# Patient Record
Sex: Male | Born: 1975 | Race: Black or African American | Hispanic: No | Marital: Single | Smoking: Never smoker
Health system: Southern US, Community
[De-identification: ages and names within clinical notes are randomized; demographics above are authoritative.]

## PROBLEM LIST (undated history)

## (undated) DIAGNOSIS — I219 Acute myocardial infarction, unspecified: Secondary | ICD-10-CM

## (undated) DIAGNOSIS — I2510 Atherosclerotic heart disease of native coronary artery without angina pectoris: Secondary | ICD-10-CM

## (undated) DIAGNOSIS — J45909 Unspecified asthma, uncomplicated: Secondary | ICD-10-CM

## (undated) HISTORY — DX: Acute myocardial infarction, unspecified: I21.9

## (undated) HISTORY — PX: CARDIAC CATHETERIZATION: SHX172

---

## 2006-11-14 ENCOUNTER — Emergency Department (HOSPITAL_COMMUNITY): Admission: EM | Admit: 2006-11-14 | Discharge: 2006-11-14 | Payer: Self-pay | Admitting: Emergency Medicine

## 2013-01-02 ENCOUNTER — Encounter (HOSPITAL_COMMUNITY): Payer: Self-pay

## 2013-01-02 ENCOUNTER — Emergency Department (HOSPITAL_COMMUNITY)
Admission: EM | Admit: 2013-01-02 | Discharge: 2013-01-02 | Disposition: A | Discharge status: Home / Self Care | Payer: BC Managed Care – PPO | Attending: Emergency Medicine | Admitting: Emergency Medicine

## 2013-01-02 DIAGNOSIS — J45909 Unspecified asthma, uncomplicated: Secondary | ICD-10-CM | POA: Insufficient documentation

## 2013-01-02 DIAGNOSIS — J329 Chronic sinusitis, unspecified: Secondary | ICD-10-CM

## 2013-01-02 DIAGNOSIS — R112 Nausea with vomiting, unspecified: Secondary | ICD-10-CM | POA: Insufficient documentation

## 2013-01-02 DIAGNOSIS — R05 Cough: Secondary | ICD-10-CM | POA: Insufficient documentation

## 2013-01-02 DIAGNOSIS — Z792 Long term (current) use of antibiotics: Secondary | ICD-10-CM | POA: Insufficient documentation

## 2013-01-02 DIAGNOSIS — Z79899 Other long term (current) drug therapy: Secondary | ICD-10-CM | POA: Insufficient documentation

## 2013-01-02 DIAGNOSIS — R059 Cough, unspecified: Secondary | ICD-10-CM | POA: Insufficient documentation

## 2013-01-02 DIAGNOSIS — Z791 Long term (current) use of non-steroidal anti-inflammatories (NSAID): Secondary | ICD-10-CM | POA: Insufficient documentation

## 2013-01-02 DIAGNOSIS — R51 Headache: Secondary | ICD-10-CM | POA: Insufficient documentation

## 2013-01-02 DIAGNOSIS — Z87891 Personal history of nicotine dependence: Secondary | ICD-10-CM | POA: Insufficient documentation

## 2013-01-02 HISTORY — DX: Unspecified asthma, uncomplicated: J45.909

## 2013-01-02 MED ORDER — CETIRIZINE HCL 10 MG PO TABS: 10.0000 mg | ORAL_TABLET | Freq: Every day | ORAL | Status: DC

## 2013-01-02 MED ORDER — AZITHROMYCIN 250 MG PO TABS: 250.0000 mg | ORAL_TABLET | Freq: Every day | ORAL | Status: DC

## 2013-01-02 NOTE — ED Provider Notes (Signed)
CSN: [id number]     Arrival date & time 01/02/13  1717 History     First MD Initiated Contact with Patient 01/02/13 1738     Chief Complaint  Patient presents with  . Sinusitis   (Consider location/radiation/quality/duration/timing/severity/associated sxs/prior Treatment) HPI Comments: 37 year old male who presents with 2 days of sinus pressure and headache that is now associated with nausea and vomiting. He also has a dry cough. He has tried over-the-counter medications such as Mucinex without any relief. This is gradually worsening, persistent, not associated with fever or chills. He does not have frequent infections, he is not a diabetic, he does not have HIV.  The history is provided by the patient.    Past Medical History  Diagnosis Date  . Asthma    History reviewed. No pertinent past surgical history. Family History  Problem Relation Age of Onset  . Stroke Father   . Hypertension Father   . Hypertension Brother    History  Substance Use Topics  . Smoking status: Former Games developer  . Smokeless tobacco: Never Used  . Alcohol Use: Yes     Comment: socially    Review of Systems  All other systems reviewed and are negative.    Allergies  Review of patient's allergies indicates no known allergies.  Home Medications   Current Outpatient Rx  Name  Route  Sig  Dispense  Refill  . dextromethorphan-guaiFENesin (MUCINEX DM) 30-600 MG per 12 hr tablet   Oral   Take 1 tablet by mouth every 12 (twelve) hours.         . naproxen sodium (ANAPROX) 220 MG tablet   Oral   Take 440 mg by mouth 2 (two) times daily with a meal.         . azithromycin (ZITHROMAX Z-PAK) 250 MG tablet   Oral   Take 1 tablet (250 mg total) by mouth daily. 500mg  PO day 1, then 250mg  PO days 205   6 tablet   0   . cetirizine (ZYRTEC ALLERGY) 10 MG tablet   Oral   Take 1 tablet (10 mg total) by mouth daily.   30 tablet   1    BP 127/69  Pulse 108  Temp(Src) 99 F (37.2 C) (Oral)  Resp  18  Ht 5\' 9"  (1.753 m)  Wt 191 lb 6 oz (86.807 kg)  BMI 28.25 kg/m2  SpO2 99% Physical Exam  Nursing note and vitals reviewed. Constitutional: He appears well-developed and well-nourished. No distress.  HENT:  Head: Normocephalic and atraumatic.  Mouth/Throat: Oropharynx is clear and moist. No oropharyngeal exudate.  Nasal congestion bilaterally with mild swollen turbinates, no significant discharge the patient does endorse blowing green mucus out of his nose earlier today. Oropharynx is clear. Tympanic membranes are clear.  Eyes: Conjunctivae and EOM are normal. Pupils are equal, round, and reactive to light. Right eye exhibits no discharge. Left eye exhibits no discharge. No scleral icterus.  Neck: Normal range of motion. Neck supple. No JVD present. No thyromegaly present.  Cardiovascular: Normal rate, regular rhythm, normal heart sounds and intact distal pulses.  Exam reveals no gallop and no friction rub.   No murmur heard. Pulmonary/Chest: Effort normal and breath sounds normal. No respiratory distress. He has no wheezes. He has no rales.  Abdominal: Soft. Bowel sounds are normal. He exhibits no distension and no mass. There is no tenderness.  Musculoskeletal: Normal range of motion. He exhibits no edema and no tenderness.  Lymphadenopathy:    He has  no cervical adenopathy.  Neurological: He is alert. Coordination normal.  Skin: Skin is warm and dry. No rash noted. No erythema.  Psychiatric: He has a normal mood and affect. His behavior is normal.    ED Course   Procedures (including critical care time)  Labs Reviewed - No data to display No results found. 1. Sinusitis     MDM  Overall the patient appears nontoxic, he is protecting his airway without any difficulty, afebrile, normal blood pressure and normal oxygen saturation. Posterior Zithromax with Zyrtec, routine follow up with family doctor, patient is in agreement with plan.   Meds given in ED:  Medications - No  data to display  New Prescriptions   AZITHROMYCIN (ZITHROMAX Z-PAK) 250 MG TABLET    Take 1 tablet (250 mg total) by mouth daily. 500mg  PO day 1, then 250mg  PO days 205   CETIRIZINE (ZYRTEC ALLERGY) 10 MG TABLET    Take 1 tablet (10 mg total) by mouth daily.      Vida Roller, MD 01/02/13 [phone]

## 2013-01-02 NOTE — ED Notes (Signed)
Patient c/o sinusitis and headache that began yesterday. Patient also c/o hacking cough.

## 2013-02-13 ENCOUNTER — Emergency Department (HOSPITAL_COMMUNITY)
Admission: EM | Admit: 2013-02-13 | Discharge: 2013-02-14 | Disposition: A | Discharge status: Home / Self Care | Payer: BC Managed Care – PPO | Attending: Emergency Medicine | Admitting: Emergency Medicine

## 2013-02-13 ENCOUNTER — Encounter (HOSPITAL_COMMUNITY): Payer: Self-pay | Admitting: *Deleted

## 2013-02-13 DIAGNOSIS — Z7982 Long term (current) use of aspirin: Secondary | ICD-10-CM | POA: Insufficient documentation

## 2013-02-13 DIAGNOSIS — Z87891 Personal history of nicotine dependence: Secondary | ICD-10-CM | POA: Insufficient documentation

## 2013-02-13 DIAGNOSIS — Z79899 Other long term (current) drug therapy: Secondary | ICD-10-CM | POA: Insufficient documentation

## 2013-02-13 DIAGNOSIS — I2510 Atherosclerotic heart disease of native coronary artery without angina pectoris: Secondary | ICD-10-CM | POA: Insufficient documentation

## 2013-02-13 DIAGNOSIS — R112 Nausea with vomiting, unspecified: Secondary | ICD-10-CM | POA: Insufficient documentation

## 2013-02-13 DIAGNOSIS — I252 Old myocardial infarction: Secondary | ICD-10-CM | POA: Insufficient documentation

## 2013-02-13 DIAGNOSIS — R109 Unspecified abdominal pain: Secondary | ICD-10-CM | POA: Insufficient documentation

## 2013-02-13 DIAGNOSIS — J45909 Unspecified asthma, uncomplicated: Secondary | ICD-10-CM | POA: Insufficient documentation

## 2013-02-13 HISTORY — DX: Atherosclerotic heart disease of native coronary artery without angina pectoris: I25.10

## 2013-02-13 NOTE — ED Notes (Signed)
Vomiting since yesterday; c/o abd pain; cramps; pt stopped plavix yesterday thinking was contributing to pain

## 2013-02-13 NOTE — ED Notes (Signed)
Pt up to restroom to try to provide urine specimen unable to urinate at this time

## 2013-02-14 LAB — BASIC METABOLIC PANEL
BUN: 11 mg/dL (ref 6–23)
CO2: 24 mEq/L (ref 19–32)
GFR calc non Af Amer: >90 mL/min (ref >90)
Glucose, Bld: 98 mg/dL (ref 70–99)
Potassium: 4.4 mEq/L (ref 3.5–5.1)

## 2013-02-14 LAB — URINALYSIS, ROUTINE W REFLEX MICROSCOPIC
Bilirubin Urine: NEGATIVE
Glucose, UA: NEGATIVE mg/dL
Hgb urine dipstick: NEGATIVE
Specific Gravity, Urine: 1.02 (ref 1.005–1.030)
Urobilinogen, UA: 0.2 mg/dL (ref 0.0–1.0)

## 2013-02-14 LAB — HEPATIC FUNCTION PANEL
ALT: 125 U/L — ABNORMAL HIGH (ref 0–53)
AST: 45 U/L — ABNORMAL HIGH (ref 0–37)
Albumin: 4 g/dL (ref 3.5–5.2)
Alkaline Phosphatase: 87 U/L (ref 39–117)
Total Bilirubin: 0.4 mg/dL (ref 0.3–1.2)

## 2013-02-14 LAB — CBC WITH DIFFERENTIAL/PLATELET
Eosinophils Absolute: 0.4 10*3/uL (ref 0.0–0.7)
Eosinophils Relative: 8 % — ABNORMAL HIGH (ref 0–5)
Hemoglobin: 15.8 g/dL (ref 13.0–17.0)
Lymphs Abs: 2.5 10*3/uL (ref 0.7–4.0)
MCH: 29.3 pg (ref 26.0–34.0)
MCV: 84 fL (ref 78.0–100.0)
Monocytes Relative: 16 % — ABNORMAL HIGH (ref 3–12)
Neutrophils Relative %: 27 % — ABNORMAL LOW (ref 43–77)
RBC: 5.39 MIL/uL (ref 4.22–5.81)

## 2013-02-14 MED ORDER — ONDANSETRON 8 MG PO TBDP
ORAL_TABLET | ORAL | Status: AC
  Filled 2013-02-14: qty 1

## 2013-02-14 MED ORDER — ONDANSETRON HCL 8 MG PO TABS: 8.0000 mg | ORAL_TABLET | Freq: Three times a day (TID) | ORAL | Status: DC | PRN

## 2013-02-14 MED ORDER — ONDANSETRON 8 MG PO TBDP
8.0000 mg | ORAL_TABLET | Freq: Once | ORAL | Status: AC
  Administered 2013-02-14: 8 mg via ORAL

## 2013-02-14 NOTE — ED Provider Notes (Signed)
CSN: [id number]     Arrival date & time 02/13/13  2223 History   First MD Initiated Contact with Patient 02/14/13 [phone]     Chief Complaint  Patient presents with  . Abdominal Pain   (Consider location/radiation/quality/duration/timing/severity/associated sxs/prior Treatment) HPI Comments: OCTAVIAN GODEK is a 37 y.o. male who presents with nausea for one month and vomiting for 2 days. The symptoms started after he began taking Plavix and Lipitor. These were started one month ago after he had a myocardial infarction. He denies fever, chills, weakness, or dizziness. There's been no chest pain, diaphoresis, back pain, or diarrhea. He is due to see his cardiologist, tomorrow for followup care. He has never had this previously. There are no other known modifying factors.   Patient is a 37 y.o. male presenting with abdominal pain. The history is provided by the patient.  Abdominal Pain   Past Medical History  Diagnosis Date  . Asthma   . Coronary artery disease    History reviewed. No pertinent past surgical history. Family History  Problem Relation Age of Onset  . Stroke Father   . Hypertension Father   . Hypertension Brother    History  Substance Use Topics  . Smoking status: Former Games developer  . Smokeless tobacco: Never Used  . Alcohol Use: Yes     Comment: socially    Review of Systems  Gastrointestinal: Positive for abdominal pain.  All other systems reviewed and are negative.    Allergies  Review of patient's allergies indicates no known allergies.  Home Medications   Current Outpatient Rx  Name  Route  Sig  Dispense  Refill  . aspirin EC 81 MG tablet   Oral   Take 81 mg by mouth daily.         Marland Kitchen atorvastatin (LIPITOR) 40 MG tablet   Oral   Take 40 mg by mouth daily.         . cetirizine (ZYRTEC ALLERGY) 10 MG tablet   Oral   Take 1 tablet (10 mg total) by mouth daily.   30 tablet   1   . clopidogrel (PLAVIX) 75 MG tablet   Oral   Take 75 mg by  mouth daily.         Marland Kitchen dextromethorphan-guaiFENesin (MUCINEX DM) 30-600 MG per 12 hr tablet   Oral   Take 1 tablet by mouth every 12 (twelve) hours.         Marland Kitchen lisinopril (PRINIVIL,ZESTRIL) 2.5 MG tablet   Oral   Take 2.5 mg by mouth daily.         . metoprolol tartrate (LOPRESSOR) 25 MG tablet   Oral   Take 25 mg by mouth 2 (two) times daily.         . naproxen sodium (ANAPROX) 220 MG tablet   Oral   Take 440 mg by mouth 2 (two) times daily with a meal.         . nitroGLYCERIN (NITROSTAT) 0.4 MG SL tablet   Sublingual   Place 0.4 mg under the tongue every 5 (five) minutes as needed for chest pain (chest pain).         . ondansetron (ZOFRAN) 8 MG tablet   Oral   Take 1 tablet (8 mg total) by mouth every 8 (eight) hours as needed for nausea.   20 tablet   0    BP 116/63  Pulse 97  Temp(Src) 98.8 F (37.1 C)  Resp 12  SpO2 99% Physical Exam  Nursing note and vitals reviewed. Constitutional: He is oriented to person, place, and time. He appears well-developed and well-nourished. No distress.  HENT:  Head: Normocephalic and atraumatic.  Right Ear: External ear normal.  Left Ear: External ear normal.  Eyes: Conjunctivae and EOM are normal. Pupils are equal, round, and reactive to light.  Neck: Normal range of motion and phonation normal. Neck supple.  Cardiovascular: Normal rate, regular rhythm, normal heart sounds and intact distal pulses.   Pulmonary/Chest: Effort normal and breath sounds normal. He exhibits no bony tenderness.  Abdominal: Soft. Normal appearance and bowel sounds are normal. He exhibits no mass. There is tenderness (mild left upper and lower quadrant abdominal tenderness). There is no rebound and no guarding.  Musculoskeletal: Normal range of motion.  Neurological: He is alert and oriented to person, place, and time. No cranial nerve deficit or sensory deficit. He exhibits normal muscle tone. Coordination normal.  Skin: Skin is warm, dry and  intact.  Psychiatric: He has a normal mood and affect. His behavior is normal. Judgment and thought content normal.    ED Course  Procedures (including critical care time)  Medications  ondansetron (ZOFRAN-ODT) disintegrating tablet 8 mg (8 mg Oral Given 02/14/13 0117)       Labs Review Labs Reviewed  CBC WITH DIFFERENTIAL - Abnormal; Notable for the following:    Neutrophils Relative % 27 (*)    Neutro Abs 1.4 (*)    Lymphocytes Relative 49 (*)    Monocytes Relative 16 (*)    Eosinophils Relative 8 (*)    All other components within normal limits  HEPATIC FUNCTION PANEL - Abnormal; Notable for the following:    AST 45 (*)    ALT 125 (*)    All other components within normal limits  BASIC METABOLIC PANEL  URINALYSIS, ROUTINE W REFLEX MICROSCOPIC  LIPASE, BLOOD   Imaging Review No results found.  MDM   1. Nausea & vomiting    Nonspecific N&V, ddx includes GE and food toxin. He is stable for d/c.   Nursing Notes Reviewed/ Care Coordinated Applicable Imaging Reviewed Interpretation of Laboratory Data incorporated into ED treatment    Flint Melter, MD 02/15/13 1048

## 2013-02-14 NOTE — ED Notes (Signed)
Pt able to tolerate fluids without emesis.  Dr. Effie Shy made aware.

## 2013-03-29 ENCOUNTER — Encounter (HOSPITAL_COMMUNITY): Payer: Self-pay | Admitting: Emergency Medicine

## 2013-03-29 ENCOUNTER — Emergency Department (HOSPITAL_COMMUNITY)
Admission: EM | Admit: 2013-03-29 | Discharge: 2013-03-29 | Disposition: A | Discharge status: Home / Self Care | Payer: BC Managed Care – PPO | Attending: Emergency Medicine | Admitting: Emergency Medicine

## 2013-03-29 DIAGNOSIS — Z79899 Other long term (current) drug therapy: Secondary | ICD-10-CM | POA: Insufficient documentation

## 2013-03-29 DIAGNOSIS — I10 Essential (primary) hypertension: Secondary | ICD-10-CM | POA: Insufficient documentation

## 2013-03-29 DIAGNOSIS — J45909 Unspecified asthma, uncomplicated: Secondary | ICD-10-CM | POA: Insufficient documentation

## 2013-03-29 DIAGNOSIS — K529 Noninfective gastroenteritis and colitis, unspecified: Secondary | ICD-10-CM

## 2013-03-29 DIAGNOSIS — Z7982 Long term (current) use of aspirin: Secondary | ICD-10-CM | POA: Insufficient documentation

## 2013-03-29 DIAGNOSIS — Z87891 Personal history of nicotine dependence: Secondary | ICD-10-CM | POA: Insufficient documentation

## 2013-03-29 DIAGNOSIS — Z7902 Long term (current) use of antithrombotics/antiplatelets: Secondary | ICD-10-CM | POA: Insufficient documentation

## 2013-03-29 DIAGNOSIS — I2510 Atherosclerotic heart disease of native coronary artery without angina pectoris: Secondary | ICD-10-CM | POA: Insufficient documentation

## 2013-03-29 DIAGNOSIS — K5289 Other specified noninfective gastroenteritis and colitis: Secondary | ICD-10-CM | POA: Insufficient documentation

## 2013-03-29 LAB — CBC WITH DIFFERENTIAL/PLATELET
Basophils Absolute: 0 10*3/uL (ref 0.0–0.1)
Basophils Relative: 0 % (ref 0–1)
Eosinophils Relative: 6 % — ABNORMAL HIGH (ref 0–5)
HCT: 43.5 % (ref 39.0–52.0)
Hemoglobin: 14.8 g/dL (ref 13.0–17.0)
MCH: 28.4 pg (ref 26.0–34.0)
MCHC: 34 g/dL (ref 30.0–36.0)
MCV: 83.3 fL (ref 78.0–100.0)
Monocytes Absolute: 0.7 10*3/uL (ref 0.1–1.0)
Monocytes Relative: 17 % — ABNORMAL HIGH (ref 3–12)
RDW: 13.4 % (ref 11.5–15.5)

## 2013-03-29 LAB — LIPASE, BLOOD: Lipase: 24 U/L (ref 11–59)

## 2013-03-29 LAB — COMPREHENSIVE METABOLIC PANEL
AST: 20 U/L (ref 0–37)
Albumin: 3.6 g/dL (ref 3.5–5.2)
BUN: 16 mg/dL (ref 6–23)
Calcium: 10.8 mg/dL — ABNORMAL HIGH (ref 8.4–10.5)
Creatinine, Ser: 0.58 mg/dL (ref 0.50–1.35)
GFR calc non Af Amer: >90 mL/min (ref >90)
Total Bilirubin: 0.4 mg/dL (ref 0.3–1.2)

## 2013-03-29 LAB — URINALYSIS, ROUTINE W REFLEX MICROSCOPIC
Glucose, UA: NEGATIVE mg/dL
Hgb urine dipstick: NEGATIVE
Leukocytes, UA: NEGATIVE
Protein, ur: NEGATIVE mg/dL
Specific Gravity, Urine: 1.021 (ref 1.005–1.030)
Urobilinogen, UA: 0.2 mg/dL (ref 0.0–1.0)

## 2013-03-29 LAB — TROPONIN I: Troponin I: <0.3 ng/mL (ref <0.30)

## 2013-03-29 MED ORDER — GLYCOPYRROLATE 0.2 MG/ML IJ SOLN
0.1000 mg | Freq: Once | INTRAMUSCULAR | Status: AC
  Administered 2013-03-29: 0.1 mg via INTRAVENOUS
  Filled 2013-03-29: qty 1

## 2013-03-29 MED ORDER — OMEPRAZOLE 20 MG PO CPDR: 20.0000 mg | DELAYED_RELEASE_CAPSULE | Freq: Two times a day (BID) | ORAL | Status: DC

## 2013-03-29 MED ORDER — PANTOPRAZOLE SODIUM 40 MG IV SOLR
40.0000 mg | Freq: Once | INTRAVENOUS | Status: AC
  Administered 2013-03-29: 40 mg via INTRAVENOUS
  Filled 2013-03-29: qty 40

## 2013-03-29 MED ORDER — ONDANSETRON 4 MG PO TBDP: 4.0000 mg | ORAL_TABLET | Freq: Three times a day (TID) | ORAL | Status: DC | PRN

## 2013-03-29 MED ORDER — ONDANSETRON HCL 4 MG/2ML IJ SOLN
4.0000 mg | Freq: Once | INTRAMUSCULAR | Status: AC
  Administered 2013-03-29: 4 mg via INTRAVENOUS
  Filled 2013-03-29: qty 2

## 2013-03-29 MED ORDER — SODIUM CHLORIDE 0.9 % IV SOLN
Freq: Once | INTRAVENOUS | Status: AC
  Administered 2013-03-29: 13:00:00 via INTRAVENOUS

## 2013-03-29 NOTE — ED Notes (Signed)
Pt c/o lower abdominal pain with vomiting since yesterday; denies diarrhea or fever.

## 2013-03-29 NOTE — ED Provider Notes (Signed)
CSN: [id number]     Arrival date & time 03/29/13  1211 History   First MD Initiated Contact with Patient 03/29/13 1208     Chief Complaint  Patient presents with  . Emesis  . Abdominal Pain    HPI  Patient presents with 48 hours of nausea vomiting diarrhea. Intermittent abdominal cramping. His symptoms are completely relieved with emesis and then recur. No blood in his emesis. Nonbilious. No bloody stools. Significant recent history for September of 2014 having undergone heart catheterization. He presented with shortness of breath and some chest pain to point [facility] had an abnormal EKG. He underwent heart cath which he states was normal. He does not know if evidenced by mouth elevation. He is on Plavix for some time. He'll follow up with his cardiologist who took him off his Plavix. He is on lisinopril for blood pressure.  States his symptoms he feels that he'll nothing like the symptoms he had been. He's been exerting himself doing well he's had no recurrent symptoms.  Past Medical History  Diagnosis Date  . Asthma   . Coronary artery disease   . Hypertension    History reviewed. No pertinent past surgical history. Family History  Problem Relation Age of Onset  . Stroke Father   . Hypertension Father   . Hypertension Brother    History  Substance Use Topics  . Smoking status: Former Games developer  . Smokeless tobacco: Never Used  . Alcohol Use: Yes     Comment: socially    Review of Systems  Constitutional: Negative for fever, chills, diaphoresis, appetite change and fatigue.  HENT: Negative for mouth sores, sore throat and trouble swallowing.   Eyes: Negative for visual disturbance.  Respiratory: Negative for cough, chest tightness, shortness of breath and wheezing.   Cardiovascular: Negative for chest pain.  Gastrointestinal: Positive for nausea, vomiting, abdominal pain and diarrhea. Negative for abdominal distention.  Endocrine: Negative for polydipsia,  polyphagia and polyuria.  Genitourinary: Negative for dysuria, frequency and hematuria.  Musculoskeletal: Negative for gait problem.  Skin: Negative for color change, pallor and rash.  Neurological: Negative for dizziness, syncope, light-headedness and headaches.  Hematological: Does not bruise/bleed easily.  Psychiatric/Behavioral: Negative for behavioral problems and confusion.    Allergies  Review of patient's allergies indicates no known allergies.  Home Medications   Current Outpatient Rx  Name  Route  Sig  Dispense  Refill  . aspirin EC 81 MG tablet   Oral   Take 81 mg by mouth daily.         Marland Kitchen lisinopril (PRINIVIL,ZESTRIL) 2.5 MG tablet   Oral   Take 2.5 mg by mouth daily.         . nitroGLYCERIN (NITROSTAT) 0.4 MG SL tablet   Sublingual   Place 0.4 mg under the tongue every 5 (five) minutes as needed for chest pain (chest pain).         Marland Kitchen omeprazole (PRILOSEC) 20 MG capsule   Oral   Take 1 capsule (20 mg total) by mouth 2 (two) times daily.   60 capsule   0   . ondansetron (ZOFRAN ODT) 4 MG disintegrating tablet   Oral   Take 1 tablet (4 mg total) by mouth every 8 (eight) hours as needed for nausea.   20 tablet   0    BP 133/56  Pulse 84  Temp(Src) 98.1 F (36.7 C) (Oral)  Resp 28  SpO2 99% Physical Exam  Constitutional: He is oriented to person,  place, and time. He appears well-developed and well-nourished. No distress.  HENT:  Head: Normocephalic.  Eyes: Conjunctivae are normal. Pupils are equal, round, and reactive to light. No scleral icterus.  Neck: Normal range of motion. Neck supple. No thyromegaly present.  Cardiovascular: Normal rate and regular rhythm.  Exam reveals no gallop and no friction rub.   No murmur heard. Pulmonary/Chest: Effort normal and breath sounds normal. No respiratory distress. He has no wheezes. He has no rales.  Abdominal: Soft. Bowel sounds are normal. He exhibits no distension. There is no tenderness. There is no  rebound.  Abdomen is soft. No focal tenderness. No guarding rebound or peritoneal irritation.  Musculoskeletal: Normal range of motion.  Neurological: He is alert and oriented to person, place, and time.  Skin: Skin is warm and dry. No rash noted.  Psychiatric: He has a normal mood and affect. His behavior is normal.    ED Course  Procedures (including critical care time) Labs Review Labs Reviewed  CBC WITH DIFFERENTIAL - Abnormal; Notable for the following:    Neutrophils Relative % 28 (*)    Neutro Abs 1.2 (*)    Lymphocytes Relative 49 (*)    Monocytes Relative 17 (*)    Eosinophils Relative 6 (*)    All other components within normal limits  COMPREHENSIVE METABOLIC PANEL - Abnormal; Notable for the following:    Glucose, Bld 116 (*)    Calcium 10.8 (*)    All other components within normal limits  LIPASE, BLOOD  TROPONIN I  URINALYSIS, ROUTINE W REFLEX MICROSCOPIC   Imaging Review No results found.  EKG Interpretation     Ventricular Rate:  94 PR Interval:  164 QRS Duration: 95 QT Interval:  354 QTC Calculation: 443 R Axis:   80 Text Interpretation:  Repolarization Abnormality. No Change vs 01-2013            MDM   1. Gastroenteritis    His blood testing is normal. His EKG shows ST elevations diffusely. Unchanged versus comparison of his discharge EKG from [facility]. Troponin is normal here. Discussion I don't feel that any way that this represents acute coronary syndrome. His vomiting and nausea are relieved with emesis. He has no chest pain. No shortness of breath. His troponin is normal and rhythm 36 hours of symptoms his EKG is unchanged.    Roney Marion, MD 03/31/13 [phone]

## 2013-03-29 NOTE — ED Notes (Signed)
MD at bedside. 

## 2013-03-29 NOTE — ED Notes (Signed)
MD at bedside. To discuss EKG with pt

## 2013-05-08 ENCOUNTER — Encounter: Payer: Self-pay | Admitting: Internal Medicine

## 2013-05-10 ENCOUNTER — Encounter: Payer: Self-pay | Admitting: *Deleted

## 2013-05-22 ENCOUNTER — Encounter: Payer: Self-pay | Admitting: Internal Medicine

## 2013-05-22 ENCOUNTER — Ambulatory Visit (INDEPENDENT_AMBULATORY_CARE_PROVIDER_SITE_OTHER): Payer: BC Managed Care – PPO | Admitting: Internal Medicine

## 2013-05-22 VITALS — BP 120/60 | HR 104 | Ht 68.5 in | Wt 161.2 lb

## 2013-05-22 DIAGNOSIS — R112 Nausea with vomiting, unspecified: Secondary | ICD-10-CM

## 2013-05-22 DIAGNOSIS — R634 Abnormal weight loss: Secondary | ICD-10-CM

## 2013-05-22 NOTE — Progress Notes (Signed)
Cameron NapoleonClarence R Obie 1976/01/08 [id number]   History of Present Illness:  This is a 38 year old African American male with intermittent nausea and vomiting. He had his first episode in September 2014 shortly after he was treated for a suspected myocardial infarction and the sickness was attributed to Plavix and Lipitor. Another episode occurred in November 2014 and he was again evaluated in the emergency room and treated symptomatically. He had mild abnormalities of liver function tests. He does not drink excessive alcohol. He has lost a tremendous amount of weight from 212 to 160 pounds. He has been able to regain about 5 pounds in the last week. He denies being under a lot of stress. He was again in the emergency room at [facility] last week with nausea, vomiting and severe crampy abdominal pain. A CT scan of the abdomen and pelvis at that time was negative. He denies taking anti-inflammatory agents. He has no prior GI history. There is no family history of inflammatory bowel disease. His bowel habits have been regular except for diarrhea with the last episode. He denies fever or rectal bleeding. Since he was started on Prilosec 20 mg daily, his symptoms have resolved. He missed one week of work but he is well today.    Past Medical History  Diagnosis Date  . Asthma   . Coronary artery disease   . Hypertension   . MI (myocardial infarction)     Past Surgical History  Procedure Laterality Date  . Cardiac catheterization      No Known Allergies  Family history and social history have been reviewed.  Review of Systems: Denies dysphagia diarrhea rectal bleeding. Positive for weight loss  The remainder of the 10 point ROS is negative except as outlined in the H&P  Physical Exam: General Appearance Well developed, in no distress Eyes  Non icteric  HEENT  Non traumatic, normocephalic  Mouth No lesion, tongue papillated, no cheilosis Neck Supple without adenopathy,  thyroid not enlarged, no carotid bruits, no JVD Lungs Clear to auscultation bilaterally COR Normal S1, normal S2, rapid  regular rhythm, no murmur, quiet precordium Abdomen Soft nontender with normoactive bowel sounds. No tenderness. Liver edge at costal margin  Rectal Not done  Extremities  No pedal edema Skin No lesions Neurological Alert and oriented x 3 Psychological Normal mood and affect  Assessment and Plan:   Problem #461 38 year old [place] male with recurrent episodes of severe nausea, vomiting and crampy periumbilical abdominal pain. His recent CT scan was negative. He had at one point mild abnormalities of liver function tests which might have been related to Lipitor. We will recheck his liver function tests today as well as his amylase and lipase. We will also check a sprue profile. We will schedule him for an upper endoscopy to rule out peptic ulcer disease, gastric outlet obstruction or gastritis. He is to continue Prilosec 20 mg daily. He has Zofran at home to take when necessary.    Lina SarDora Tsugio Elison 05/22/2013

## 2013-05-22 NOTE — Patient Instructions (Addendum)
You have been scheduled for an endoscopy with propofol. Please follow written instructions given to you at your visit today. If you use inhalers (even only as needed), please bring them with you on the day of your procedure. Your physician has requested that you go to www.startemmi.com and enter the access code given to you at your visit today. This web site gives a general overview about your procedure. However, you should still follow specific instructions given to you by our office regarding your preparation for the procedure.  Your physician has requested that you go to the basement for the following lab work before leaving today: Celiac 10, Hepatic Function, Amylase, Lipase  We will request your records from [facility].   Continue your omeprazole once daily.  We have given you an excuse for work today as well as the day of your procedure.

## 2013-05-23 ENCOUNTER — Encounter: Payer: Self-pay | Admitting: Internal Medicine

## 2013-06-08 NOTE — H&P (View-Only) (Signed)
Cameron NapoleonClarence R Osborn 1975-07-16 [id number]   History of Present Illness:  This is a 38 year old African American male with intermittent nausea and vomiting. He had his first episode in September 2014 shortly after he was treated for a suspected myocardial infarction and the sickness was attributed to Plavix and Lipitor. Another episode occurred in November 2014 and he was again evaluated in the emergency room and treated symptomatically. He had mild abnormalities of liver function tests. He does not drink excessive alcohol. He has lost a tremendous amount of weight from 212 to 160 pounds. He has been able to regain about 5 pounds in the last week. He denies being under a lot of stress. He was again in the emergency room at [facility] last week with nausea, vomiting and severe crampy abdominal pain. A CT scan of the abdomen and pelvis at that time was negative. He denies taking anti-inflammatory agents. He has no prior GI history. There is no family history of inflammatory bowel disease. His bowel habits have been regular except for diarrhea with the last episode. He denies fever or rectal bleeding. Since he was started on Prilosec 20 mg daily, his symptoms have resolved. He missed one week of work but he is well today.    Past Medical History  Diagnosis Date  . Asthma   . Coronary artery disease   . Hypertension   . MI (myocardial infarction)     Past Surgical History  Procedure Laterality Date  . Cardiac catheterization      No Known Allergies  Family history and social history have been reviewed.  Review of Systems: Denies dysphagia diarrhea rectal bleeding. Positive for weight loss  The remainder of the 10 point ROS is negative except as outlined in the H&P  Physical Exam: General Appearance Well developed, in no distress Eyes  Non icteric  HEENT  Non traumatic, normocephalic  Mouth No lesion, tongue papillated, no cheilosis Neck Supple without adenopathy,  thyroid not enlarged, no carotid bruits, no JVD Lungs Clear to auscultation bilaterally COR Normal S1, normal S2, rapid  regular rhythm, no murmur, quiet precordium Abdomen Soft nontender with normoactive bowel sounds. No tenderness. Liver edge at costal margin  Rectal Not done  Extremities  No pedal edema Skin No lesions Neurological Alert and oriented x 3 Psychological Normal mood and affect  Assessment and Plan:   Problem #521 38 year old [place] male with recurrent episodes of severe nausea, vomiting and crampy periumbilical abdominal pain. His recent CT scan was negative. He had at one point mild abnormalities of liver function tests which might have been related to Lipitor. We will recheck his liver function tests today as well as his amylase and lipase. We will also check a sprue profile. We will schedule him for an upper endoscopy to rule out peptic ulcer disease, gastric outlet obstruction or gastritis. He is to continue Prilosec 20 mg daily. He has Zofran at home to take when necessary.    Lina SarDora Brodie 05/22/2013

## 2013-06-08 NOTE — Interval H&P Note (Signed)
History and Physical Interval Note:  06/08/2013 9:02 PM  [name]  has presented today for surgery, with the diagnosis of nausea and vomiting  The various methods of treatment have been discussed with the patient and family. After consideration of risks, benefits and other options for treatment, the patient has consented to  Procedure(s): ESOPHAGOGASTRODUODENOSCOPY (EGD) (N/A) as a surgical intervention .  The patient's history has been reviewed, patient examined, no change in status, stable for surgery.  I have reviewed the patient's chart and labs.  Questions were answered to the patient's satisfaction.     Lina Sarora Elzina Devera

## 2013-06-09 ENCOUNTER — Ambulatory Visit (HOSPITAL_COMMUNITY)
Admission: RE | Admit: 2013-06-09 | Discharge: 2013-06-09 | Disposition: A | Discharge status: Home / Self Care | Payer: BC Managed Care – PPO | Source: Ambulatory Visit | Attending: Internal Medicine | Admitting: Internal Medicine

## 2013-06-09 ENCOUNTER — Encounter (HOSPITAL_COMMUNITY): Payer: Self-pay

## 2013-06-09 ENCOUNTER — Encounter (HOSPITAL_COMMUNITY)
Admission: RE | Disposition: A | Discharge status: Home / Self Care | Payer: Self-pay | Source: Ambulatory Visit | Attending: Internal Medicine

## 2013-06-09 DIAGNOSIS — J45909 Unspecified asthma, uncomplicated: Secondary | ICD-10-CM | POA: Insufficient documentation

## 2013-06-09 DIAGNOSIS — R197 Diarrhea, unspecified: Secondary | ICD-10-CM | POA: Insufficient documentation

## 2013-06-09 DIAGNOSIS — R1013 Epigastric pain: Secondary | ICD-10-CM | POA: Insufficient documentation

## 2013-06-09 DIAGNOSIS — I10 Essential (primary) hypertension: Secondary | ICD-10-CM | POA: Insufficient documentation

## 2013-06-09 DIAGNOSIS — R112 Nausea with vomiting, unspecified: Secondary | ICD-10-CM | POA: Insufficient documentation

## 2013-06-09 DIAGNOSIS — I2510 Atherosclerotic heart disease of native coronary artery without angina pectoris: Secondary | ICD-10-CM | POA: Insufficient documentation

## 2013-06-09 DIAGNOSIS — I252 Old myocardial infarction: Secondary | ICD-10-CM | POA: Insufficient documentation

## 2013-06-09 HISTORY — PX: ESOPHAGOGASTRODUODENOSCOPY: SHX5428

## 2013-06-09 SURGERY — EGD (ESOPHAGOGASTRODUODENOSCOPY)
Anesthesia: Moderate Sedation

## 2013-06-09 MED ORDER — BUTAMBEN-TETRACAINE-BENZOCAINE 2-2-14 % EX AERO
INHALATION_SPRAY | CUTANEOUS | Status: DC | PRN
Start: 2013-06-09 — End: 2013-06-09
  Administered 2013-06-09: 2 via TOPICAL

## 2013-06-09 MED ORDER — MIDAZOLAM HCL 10 MG/2ML IJ SOLN
INTRAMUSCULAR | Status: DC | PRN
  Administered 2013-06-09 (×3): 2 mg via INTRAVENOUS

## 2013-06-09 MED ORDER — FENTANYL CITRATE 0.05 MG/ML IJ SOLN
INTRAMUSCULAR | Status: DC | PRN
  Administered 2013-06-09 (×3): 25 ug via INTRAVENOUS

## 2013-06-09 MED ORDER — SODIUM CHLORIDE 0.9 % IV SOLN
INTRAVENOUS | Status: DC
  Administered 2013-06-09: 500 mL via INTRAVENOUS

## 2013-06-09 MED ORDER — FENTANYL CITRATE 0.05 MG/ML IJ SOLN
INTRAMUSCULAR | Status: AC
  Filled 2013-06-09: qty 2

## 2013-06-09 MED ORDER — MIDAZOLAM HCL 10 MG/2ML IJ SOLN
INTRAMUSCULAR | Status: AC
  Filled 2013-06-09: qty 2

## 2013-06-09 NOTE — Op Note (Signed)
[facility] [address] [place], [id number]   ENDOSCOPY PROCEDURE REPORT  PATIENT: Cameron NapoleonHolloway, [name]  MR#: [id number] BIRTHDATE: Feb 06, 1976 , 37  yrs. old GENDER: Male ENDOSCOPIST: Hart Carwinora M Eily Louvier, MD REFERRED BY:  none PROCEDURE DATE:  06/09/2013 PROCEDURE:  EGD, diagnostic ASA CLASS:     Class I INDICATIONS:  nausea and vomiting, epigastric abdominal pain, improved on Prilosec. MEDICATIONS: These medications were titrated to patient response per physician's verbal order, Fentanyl 75 mcg IV, and Versed 6 mg IV TOPICAL ANESTHETIC: Cetacaine Spray  DESCRIPTION OF PROCEDURE: After the risks benefits and alternatives of the procedure were thoroughly explained, informed consent was obtained.  The Pentax Gastroscope [id number] endoscope was introduced through the mouth and advanced to the second portion of the duodenum. Without limitations.  The instrument was slowly withdrawn as the mucosa was fully examined.      Esophagus: proximal mid and distal esophageal mucosa appeared normal. There was no esophagitis or stricture Stomach: Gastric mucosa appeared normal. Rugal pattern was unremarkable. Gastric antrum and pyloric outlet with stool, retroflexion of the scope revealed normal fundus and cardia. There was no hiatal hernia Duodenum: Duodenal bulb and descending duodenum was normal[ The scope was then withdrawn from the patient and the procedure completed.  COMPLICATIONS: There were no complications. ENDOSCOPIC IMPRESSION: normal upper endoscopy of the esophagus stomach and duodenum  RECOMMENDATIONS: Continue Prilosec 20 mg daily, a change to when necessary depending on symptoms REPEAT EXAM: no  eSigned:  Hart Carwinora M Allecia Bells, MD 06/09/2013 10:06 AM   CC:

## 2013-06-09 NOTE — Discharge Instructions (Signed)
Gastrointestinal Endoscopy °Care After °Refer to this sheet in the next few weeks. These instructions provide you with information on caring for yourself after your procedure. Your caregiver may also give you more specific instructions. Your treatment has been planned according to current medical practices, but problems sometimes occur. Call your caregiver if you have any problems or questions after your procedure. °HOME CARE INSTRUCTIONS °· If you were given medicine to help you relax (sedative), do not drive, operate machinery, or sign important documents for 24 hours. °· Avoid alcohol and hot or warm beverages for the first 24 hours after the procedure. °· Only take over-the-counter or prescription medicines for pain, discomfort, or fever as directed by your caregiver. You may resume taking your normal medicines unless your caregiver tells you otherwise. Ask your caregiver when you may resume taking medicines that may cause bleeding, such as aspirin, clopidogrel, or warfarin. °· You may return to your normal diet and activities on the day after your procedure, or as directed by your caregiver. Walking may help to reduce any bloated feeling in your abdomen. °· Drink enough fluids to keep your urine clear or pale yellow. °· You may gargle with salt water if you have a sore throat. °SEEK IMMEDIATE MEDICAL CARE IF: °· You have severe nausea or vomiting. °· You have severe abdominal pain, abdominal cramps that last longer than 6 hours, or abdominal swelling (distention). °· You have severe shoulder or back pain. °· You have trouble swallowing. °· You have shortness of breath, your breathing is shallow, or you are breathing faster than normal. °· You have a fever or a rapid heartbeat. °· You vomit blood or material that looks like coffee grounds. °· You have bloody, black, or tarry stools. °MAKE SURE YOU: °· Understand these instructions. °· Will watch your condition. °· Will get help right away if you are not doing  well or get worse. °Document Released: 12/17/2003 Document Revised: 11/03/2011 Document Reviewed: 08/04/2011 °ExitCare® Patient Information ©2014 [employer]. ° °

## 2013-06-09 NOTE — Interval H&P Note (Signed)
History and Physical Interval Note:  06/09/2013 10:00 AM  Cameron Osborn  has presented today for surgery, with the diagnosis of nausea and vomiting  The various methods of treatment have been discussed with the patient and family. After consideration of risks, benefits and other options for treatment, the patient has consented to  Procedure(s): ESOPHAGOGASTRODUODENOSCOPY (EGD) (N/A) as a surgical intervention .  The patient's history has been reviewed, patient examined, no change in status, stable for surgery.  I have reviewed the patient's chart and labs.  Questions were answered to the patient's satisfaction.     [name]

## 2013-06-12 ENCOUNTER — Encounter (HOSPITAL_COMMUNITY): Payer: Self-pay | Admitting: Internal Medicine

## 2013-06-12 ENCOUNTER — Emergency Department (HOSPITAL_COMMUNITY)
Admission: EM | Admit: 2013-06-12 | Discharge: 2013-06-12 | Disposition: A | Discharge status: Home / Self Care | Payer: BC Managed Care – PPO | Attending: Emergency Medicine | Admitting: Emergency Medicine

## 2013-06-12 DIAGNOSIS — J029 Acute pharyngitis, unspecified: Secondary | ICD-10-CM | POA: Insufficient documentation

## 2013-06-12 DIAGNOSIS — Z79899 Other long term (current) drug therapy: Secondary | ICD-10-CM | POA: Insufficient documentation

## 2013-06-12 DIAGNOSIS — H9209 Otalgia, unspecified ear: Secondary | ICD-10-CM | POA: Insufficient documentation

## 2013-06-12 DIAGNOSIS — I2510 Atherosclerotic heart disease of native coronary artery without angina pectoris: Secondary | ICD-10-CM | POA: Insufficient documentation

## 2013-06-12 DIAGNOSIS — I10 Essential (primary) hypertension: Secondary | ICD-10-CM | POA: Insufficient documentation

## 2013-06-12 DIAGNOSIS — Z7982 Long term (current) use of aspirin: Secondary | ICD-10-CM | POA: Insufficient documentation

## 2013-06-12 DIAGNOSIS — Z9889 Other specified postprocedural states: Secondary | ICD-10-CM | POA: Insufficient documentation

## 2013-06-12 DIAGNOSIS — J45909 Unspecified asthma, uncomplicated: Secondary | ICD-10-CM | POA: Insufficient documentation

## 2013-06-12 DIAGNOSIS — I252 Old myocardial infarction: Secondary | ICD-10-CM | POA: Insufficient documentation

## 2013-06-12 MED ORDER — DEXAMETHASONE 6 MG PO TABS
6.0000 mg | ORAL_TABLET | Freq: Once | ORAL | Status: AC
  Administered 2013-06-12: 6 mg via ORAL
  Filled 2013-06-12: qty 1

## 2013-06-12 NOTE — ED Notes (Addendum)
Pt states that he had an endoscopy done on Friday and has had a sore throat since. Alert and oriented. Speaking in complete sentences.

## 2013-06-12 NOTE — ED Provider Notes (Signed)
CSN: [id number]     Arrival date & time 06/12/13  2203 History  This chart was scribed for non-physician practitioner Earley Favor working with Layla Maw Ward, DO by Carl Best, ED Scribe. This patient was seen in room WTR6/WTR6 and the patient's care was started at 10:43 PM.     Chief Complaint  Patient presents with  . Sore Throat    Patient is a 38 y.o. male presenting with pharyngitis. The history is provided by the patient. No language interpreter was used.  Sore Throat This is a recurrent problem. The current episode started in the past 7 days. The problem occurs constantly. The problem has been unchanged. Pertinent negatives include no headaches and no shortness of breath. The symptoms are aggravated by swallowing. He has tried NSAIDs (One dose) for the symptoms. The treatment provided no relief.  Sore Throat This is a recurrent problem. The current episode started in the past 7 days. The problem occurs constantly. The problem has been unchanged. Associated symptoms include a sore throat. Pertinent negatives include no coughing, fever, headaches or nausea. The symptoms are aggravated by swallowing. He has tried NSAIDs (One dose) for the symptoms. The treatment provided no relief.   HPI Comments: Cameron Osborn is a 38 y.o. male who presents to the Emergency Department complaining of a worsening sore throat that started after the patient had an endoscopy three days ago.  The patient lists right sided otalgia as an associated symptom.  He denies fever and difficulty swallowing as associated symptoms.   He states that he took Advil at 4:25 AM this morning with no relief to his symptoms.  He states that the discharge note told him to expect discomfort after his procedure but he states that his current symptoms did not start in the time frame he was expecting.  He states that his endoscopy was performed by Dr. Dickie La. He states that he has not taken Prilosec since November of 2014.    Past  Medical History  Diagnosis Date  . Asthma   . Coronary artery disease   . Hypertension   . MI (myocardial infarction)    Past Surgical History  Procedure Laterality Date  . Cardiac catheterization    . Esophagogastroduodenoscopy N/A 06/09/2013    Procedure: ESOPHAGOGASTRODUODENOSCOPY (EGD);  Surgeon: Hart Carwin, MD;  Location: [facility];  Service: Endoscopy;  Laterality: N/A;   Family History  Problem Relation Age of Onset  . Stroke Father   . Hypertension Father   . Hypertension Brother    History  Substance Use Topics  . Smoking status: Never Smoker   . Smokeless tobacco: Never Used  . Alcohol Use: Yes     Comment: socially    Review of Systems  Constitutional: Negative for fever.  HENT: Positive for ear pain and sore throat. Negative for ear discharge (right ear), facial swelling and trouble swallowing.   Respiratory: Negative for cough and shortness of breath.   Gastrointestinal: Negative for nausea.  Neurological: Negative for dizziness and headaches.  All other systems reviewed and are negative.    Allergies  Review of patient's allergies indicates no known allergies.  Home Medications   Current Outpatient Rx  Name  Route  Sig  Dispense  Refill  . aspirin EC 81 MG tablet   Oral   Take 81 mg by mouth daily.         Marland Kitchen omeprazole (PRILOSEC) 20 MG capsule   Oral   Take 20 mg by mouth daily.         Marland Kitchen  ondansetron (ZOFRAN ODT) 4 MG disintegrating tablet   Oral   Take 1 tablet (4 mg total) by mouth every 8 (eight) hours as needed for nausea.   20 tablet   0   . nitroGLYCERIN (NITROSTAT) 0.4 MG SL tablet   Sublingual   Place 0.4 mg under the tongue every 5 (five) minutes as needed for chest pain (chest pain).          Triage Vitals: BP 116/72  Pulse 109  Temp(Src) 98.6 F (37 C) (Oral)  SpO2 98%  Physical Exam  Nursing note and vitals reviewed. Constitutional: He is oriented to person, place, and time. He appears well-developed and  well-nourished. No distress.  HENT:  Head: Normocephalic and atraumatic.  Right Ear: Hearing, tympanic membrane, external ear and ear canal normal.  Left Ear: Hearing, tympanic membrane, external ear and ear canal normal.  Eyes: Pupils are equal, round, and reactive to light.  Neck: Normal range of motion. Neck supple. No tracheal deviation present.  Cardiovascular: Normal rate.   Pulmonary/Chest: Effort normal and breath sounds normal. No stridor. No respiratory distress. He has no wheezes.  Musculoskeletal: Normal range of motion.  Lymphadenopathy:    He has no cervical adenopathy.  Neurological: He is alert and oriented to person, place, and time.  Skin: Skin is warm and dry.  Psychiatric: He has a normal mood and affect. His behavior is normal.    ED Course  Procedures (including critical care time)  DIAGNOSTIC STUDIES: Oxygen Saturation is 98% on room air, normal by my interpretation.    COORDINATION OF CARE: 10:46 PM- Discussed consulting with Dr. Elesa MassedWard before administering medication in the ED to treat the patient's discomfort.  The patient agreed to the treatment plan.   Labs Review Labs Reviewed - No data to display Imaging Review No results found.  EKG Interpretation   None       MDM   1. Pharyngitis     She was given a one-time dose of Decadron to decrease the swelling, and Dr. to followup with Dr. Dickie LaBrody.  If he still having discomfort, and pain in To 2, days  I personally performed the services described in this documentation, which was scribed in my presence. The recorded information has been reviewed and is accurate.     Arman FilterGail K Ita Fritzsche, NP 06/12/13 2310

## 2013-06-12 NOTE — ED Provider Notes (Signed)
Medical screening examination/treatment/procedure(s) were performed by non-physician practitioner and as supervising physician I was immediately available for consultation/collaboration.  EKG Interpretation   None         Layla MawKristen N Ward, DO 06/12/13 2330

## 2013-06-12 NOTE — Discharge Instructions (Signed)
Is normal to have discomfort after a surgical procedure.  You have been given a medication called, Decadron.  This is a one time dose of a potent steroid to help with the swelling, which is most likely causing your discomfort, if you're still having significant discomfort in the next one to 2 days.  Please call Dr. Dickie LaBrody, who performed her procedure for further evaluation and treatment

## 2013-06-28 ENCOUNTER — Telehealth: Payer: Self-pay | Admitting: Internal Medicine

## 2013-06-28 NOTE — Telephone Encounter (Signed)
Spoke with patient and he will have the [facility] fax[facility] to us for review. After, we receive records, will work on scheduling patient as needed.

## 2013-06-29 ENCOUNTER — Emergency Department (HOSPITAL_COMMUNITY)
Admission: EM | Admit: 2013-06-29 | Discharge: 2013-06-29 | Disposition: A | Discharge status: Home / Self Care | Payer: BC Managed Care – PPO | Attending: Emergency Medicine | Admitting: Emergency Medicine

## 2013-06-29 ENCOUNTER — Encounter (HOSPITAL_COMMUNITY): Payer: Self-pay | Admitting: Emergency Medicine

## 2013-06-29 DIAGNOSIS — I2510 Atherosclerotic heart disease of native coronary artery without angina pectoris: Secondary | ICD-10-CM | POA: Insufficient documentation

## 2013-06-29 DIAGNOSIS — J45909 Unspecified asthma, uncomplicated: Secondary | ICD-10-CM | POA: Insufficient documentation

## 2013-06-29 DIAGNOSIS — Z79899 Other long term (current) drug therapy: Secondary | ICD-10-CM | POA: Insufficient documentation

## 2013-06-29 DIAGNOSIS — Z9889 Other specified postprocedural states: Secondary | ICD-10-CM | POA: Insufficient documentation

## 2013-06-29 DIAGNOSIS — R197 Diarrhea, unspecified: Secondary | ICD-10-CM

## 2013-06-29 DIAGNOSIS — Z7982 Long term (current) use of aspirin: Secondary | ICD-10-CM | POA: Insufficient documentation

## 2013-06-29 DIAGNOSIS — I10 Essential (primary) hypertension: Secondary | ICD-10-CM | POA: Insufficient documentation

## 2013-06-29 DIAGNOSIS — R000 Tachycardia, unspecified: Secondary | ICD-10-CM | POA: Insufficient documentation

## 2013-06-29 DIAGNOSIS — I252 Old myocardial infarction: Secondary | ICD-10-CM | POA: Insufficient documentation

## 2013-06-29 NOTE — ED Notes (Signed)
Pt states had the stomach virus 2 days ago, was seen at [facility] for it, states hasn't vomited in 2 days but still having diarrhea, pt states he needs a doctors note for work, states can't work when he is getting up every few minutes to use the restroom.

## 2013-06-29 NOTE — Discharge Instructions (Signed)
Diarrhea Diarrhea is frequent loose and watery bowel movements. It can cause you to feel weak and dehydrated. Dehydration can cause you to become tired and thirsty, have a dry mouth, and have decreased urination that often is dark yellow. Diarrhea is a sign of another problem, most often an infection that will not last long. In most cases, diarrhea typically lasts 2 3 days. However, it can last longer if it is a sign of something more serious. It is important to treat your diarrhea as directed by your caregive to lessen or prevent future episodes of diarrhea. CAUSES  Some common causes include:  Gastrointestinal infections caused by viruses, bacteria, or parasites.  Food poisoning or food allergies.  Certain medicines, such as antibiotics, chemotherapy, and laxatives.  Artificial sweeteners and fructose.  Digestive disorders. HOME CARE INSTRUCTIONS  Ensure adequate fluid intake (hydration): have 1 cup (8 oz) of fluid for each diarrhea episode. Avoid fluids that contain simple sugars or sports drinks, fruit juices, whole milk products, and sodas. Your urine should be clear or pale yellow if you are drinking enough fluids. Hydrate with an oral rehydration solution that you can purchase at pharmacies, retail stores, and online. You can prepare an oral rehydration solution at home by mixing the following ingredients together:    tsp table salt.   tsp baking soda.   tsp salt substitute containing potassium chloride.  1  tablespoons sugar.  1 L (34 oz) of water.  Certain foods and beverages may increase the speed at which food moves through the gastrointestinal (GI) tract. These foods and beverages should be avoided and include:  Caffeinated and alcoholic beverages.  High-fiber foods, such as raw fruits and vegetables, nuts, seeds, and whole grain breads and cereals.  Foods and beverages sweetened with sugar alcohols, such as xylitol, sorbitol, and mannitol.  Some foods may be well  tolerated and may help thicken stool including:  Starchy foods, such as rice, toast, pasta, low-sugar cereal, oatmeal, grits, baked potatoes, crackers, and bagels.  Bananas.  Applesauce.  Add probiotic-rich foods to help increase healthy bacteria in the GI tract, such as yogurt and fermented milk products.  Wash your hands well after each diarrhea episode.  Only take over-the-counter or prescription medicines as directed by your caregiver.  Take a warm bath to relieve any burning or pain from frequent diarrhea episodes. SEEK IMMEDIATE MEDICAL CARE IF:   You are unable to keep fluids down.  You have persistent vomiting.  You have blood in your stool, or your stools are black and tarry.  You do not urinate in 6 8 hours, or there is only a small amount of very dark urine.  You have abdominal pain that increases or localizes.  You have weakness, dizziness, confusion, or lightheadedness.  You have a severe headache.  Your diarrhea gets worse or does not get better.  You have a fever or persistent symptoms for more than 2 3 days.  You have a fever and your symptoms suddenly get worse. MAKE SURE YOU:   Understand these instructions.  Will watch your condition.  Will get help right away if you are not doing well or get worse. Document Released: 04/24/2002 Document Revised: 04/20/2012 Document Reviewed: 01/10/2012 ExitCare Patient Information 2014 [employer].  

## 2013-06-29 NOTE — Telephone Encounter (Signed)
Left a message for patient to call me. ( We have not received records)

## 2013-06-29 NOTE — ED Provider Notes (Signed)
CSN: [id number]     Arrival date & time 06/29/13  2307 History   First MD Initiated Contact with Patient 06/29/13 2316     Chief Complaint  Patient presents with  . Diarrhea     (Consider location/radiation/quality/duration/timing/severity/associated sxs/prior Treatment) HPI Comments: 38 year old male presents to the emergency department requesting a doctor's note for work. Patient states he was seen at [facility] department for a stomach virus 2 days ago, states he is feeling completely better other than continued diarrhea. Denies abdominal pain, nausea, vomiting, fever, chills, bloody diarrhea, weakness, lightheadedness or dizziness.  Patient is a 38 y.o. male presenting with diarrhea. The history is provided by the patient.  Diarrhea Associated symptoms: no fever     Past Medical History  Diagnosis Date  . Asthma   . Coronary artery disease   . Hypertension   . MI (myocardial infarction)    Past Surgical History  Procedure Laterality Date  . Cardiac catheterization    . Esophagogastroduodenoscopy N/A 06/09/2013    Procedure: ESOPHAGOGASTRODUODENOSCOPY (EGD);  Surgeon: Hart Carwin, MD;  Location: [facility];  Service: Endoscopy;  Laterality: N/A;   Family History  Problem Relation Age of Onset  . Stroke Father   . Hypertension Father   . Hypertension Brother    History  Substance Use Topics  . Smoking status: Never Smoker   . Smokeless tobacco: Never Used  . Alcohol Use: Yes     Comment: socially    Review of Systems  Constitutional: Negative for fever.  HENT: Negative.   Respiratory: Negative.   Cardiovascular: Negative.   Gastrointestinal: Positive for diarrhea.  Neurological: Negative for weakness.      Allergies  Review of patient's allergies indicates no known allergies.  Home Medications   Current Outpatient Rx  Name  Route  Sig  Dispense  Refill  . aspirin EC 81 MG tablet   Oral   Take 81 mg by mouth daily.         .  nitroGLYCERIN (NITROSTAT) 0.4 MG SL tablet   Sublingual   Place 0.4 mg under the tongue every 5 (five) minutes as needed for chest pain (chest pain).         Marland Kitchen omeprazole (PRILOSEC) 20 MG capsule   Oral   Take 20 mg by mouth daily.         . ondansetron (ZOFRAN ODT) 4 MG disintegrating tablet   Oral   Take 1 tablet (4 mg total) by mouth every 8 (eight) hours as needed for nausea.   20 tablet   0    BP 124/62  Pulse 117  Temp(Src) 97.9 F (36.6 C) (Oral)  Resp 16  SpO2 99% Physical Exam  Nursing note and vitals reviewed. Constitutional: He is oriented to person, place, and time. He appears well-developed and well-nourished. No distress.  HENT:  Head: Normocephalic and atraumatic.  Mouth/Throat: Oropharynx is clear and moist.  Eyes: Conjunctivae are normal.  Neck: Normal range of motion. Neck supple.  Cardiovascular: Normal rate, regular rhythm and normal heart sounds.   Pulmonary/Chest: Effort normal and breath sounds normal.  Abdominal: Soft. Bowel sounds are normal. He exhibits no distension. There is no tenderness.  Musculoskeletal: Normal range of motion. He exhibits no edema.  Neurological: He is alert and oriented to person, place, and time.  Skin: Skin is warm and dry. He is not diaphoretic.  Psychiatric: He has a normal mood and affect. His behavior is normal.    ED Course  Procedures (including critical care time) Labs Review Labs Reviewed - No data to display Imaging Review No results found.  EKG Interpretation   None       MDM   Final diagnoses:  Diarrhea    Patient presenting for a work note. He appears in no apparent distress. It is noted in triage patient is tachycardic, heart rate on my exam around 80. Abdomen is soft and nontender. Note given stating that patient was seen in the emergency department today. Stable for discharge. Return precautions given. Patient states understanding of treatment care plan and is agreeable.     Trevor MaceRobyn M  Albert, PA-C 06/29/13 2326

## 2013-07-01 NOTE — ED Provider Notes (Signed)
Medical screening examination/treatment/procedure(s) were performed by non-physician practitioner and as supervising physician I was immediately available for consultation/collaboration.  EKG Interpretation   None        Derwood KaplanAnkit Kabao Leite, MD 07/01/13 [phone]

## 2013-07-03 ENCOUNTER — Ambulatory Visit: Payer: BC Managed Care – PPO | Admitting: Gastroenterology

## 2013-07-03 NOTE — Telephone Encounter (Signed)
Patient due for OV today at 3:00 PM

## 2014-04-16 ENCOUNTER — Encounter (HOSPITAL_COMMUNITY): Payer: Self-pay | Admitting: Emergency Medicine

## 2014-04-16 ENCOUNTER — Observation Stay (HOSPITAL_COMMUNITY)
Admission: EM | Admit: 2014-04-16 | Discharge: 2014-04-18 | Disposition: A | Discharge status: Home / Self Care | Payer: Self-pay | Attending: Internal Medicine | Admitting: Internal Medicine

## 2014-04-16 ENCOUNTER — Emergency Department (HOSPITAL_COMMUNITY): Payer: Self-pay

## 2014-04-16 DIAGNOSIS — I2510 Atherosclerotic heart disease of native coronary artery without angina pectoris: Secondary | ICD-10-CM | POA: Insufficient documentation

## 2014-04-16 DIAGNOSIS — R079 Chest pain, unspecified: Secondary | ICD-10-CM

## 2014-04-16 DIAGNOSIS — I471 Supraventricular tachycardia: Secondary | ICD-10-CM

## 2014-04-16 DIAGNOSIS — I252 Old myocardial infarction: Secondary | ICD-10-CM | POA: Insufficient documentation

## 2014-04-16 DIAGNOSIS — R072 Precordial pain: Principal | ICD-10-CM | POA: Insufficient documentation

## 2014-04-16 DIAGNOSIS — R000 Tachycardia, unspecified: Secondary | ICD-10-CM | POA: Insufficient documentation

## 2014-04-16 DIAGNOSIS — R0789 Other chest pain: Secondary | ICD-10-CM | POA: Insufficient documentation

## 2014-04-16 DIAGNOSIS — R0602 Shortness of breath: Secondary | ICD-10-CM | POA: Insufficient documentation

## 2014-04-16 DIAGNOSIS — R42 Dizziness and giddiness: Secondary | ICD-10-CM | POA: Insufficient documentation

## 2014-04-16 DIAGNOSIS — K219 Gastro-esophageal reflux disease without esophagitis: Secondary | ICD-10-CM | POA: Insufficient documentation

## 2014-04-16 DIAGNOSIS — J45909 Unspecified asthma, uncomplicated: Secondary | ICD-10-CM | POA: Insufficient documentation

## 2014-04-16 DIAGNOSIS — R9431 Abnormal electrocardiogram [ECG] [EKG]: Secondary | ICD-10-CM | POA: Insufficient documentation

## 2014-04-16 DIAGNOSIS — R7989 Other specified abnormal findings of blood chemistry: Secondary | ICD-10-CM

## 2014-04-16 DIAGNOSIS — I517 Cardiomegaly: Secondary | ICD-10-CM | POA: Insufficient documentation

## 2014-04-16 LAB — CBC
HCT: 43.8 % (ref 39.0–52.0)
Hemoglobin: 15.3 g/dL (ref 13.0–17.0)
MCH: 28.7 pg (ref 26.0–34.0)
MCHC: 34.9 g/dL (ref 30.0–36.0)
MCV: 82 fL (ref 78.0–100.0)
PLATELETS: 200 10*3/uL (ref 150–400)
RBC: 5.34 MIL/uL (ref 4.22–5.81)
RDW: 13.1 % (ref 11.5–15.5)
WBC: 4.1 10*3/uL (ref 4.0–10.5)

## 2014-04-16 LAB — BASIC METABOLIC PANEL
ANION GAP: 15 (ref 5–15)
BUN: 11 mg/dL (ref 6–23)
CHLORIDE: 101 meq/L (ref 96–112)
CO2: 23 mEq/L (ref 19–32)
CREATININE: 0.79 mg/dL (ref 0.50–1.35)
Calcium: 9.5 mg/dL (ref 8.4–10.5)
GFR calc non Af Amer: >90 mL/min (ref >90)
Glucose, Bld: 95 mg/dL (ref 70–99)
POTASSIUM: 3.5 meq/L — AB (ref 3.7–5.3)
Sodium: 139 mEq/L (ref 137–147)

## 2014-04-16 LAB — D-DIMER, QUANTITATIVE: D-Dimer, Quant: 0.7 ug/mL-FEU — ABNORMAL HIGH (ref 0.00–0.48)

## 2014-04-16 LAB — I-STAT TROPONIN, ED: TROPONIN I, POC: 0 ng/mL (ref 0.00–0.08)

## 2014-04-16 MED ORDER — ONDANSETRON HCL 4 MG/2ML IJ SOLN
4.0000 mg | Freq: Once | INTRAMUSCULAR | Status: AC
  Administered 2014-04-16: 4 mg via INTRAVENOUS
  Filled 2014-04-16: qty 2

## 2014-04-16 MED ORDER — METOPROLOL TARTRATE 25 MG PO TABS
25.0000 mg | ORAL_TABLET | Freq: Two times a day (BID) | ORAL | Status: DC
  Administered 2014-04-16 – 2014-04-18 (×4): 25 mg via ORAL
  Filled 2014-04-16 (×4): qty 1

## 2014-04-16 MED ORDER — ONDANSETRON HCL 4 MG/2ML IJ SOLN: 4.0000 mg | Freq: Four times a day (QID) | INTRAMUSCULAR | Status: DC | PRN

## 2014-04-16 MED ORDER — MORPHINE SULFATE 4 MG/ML IJ SOLN
4.0000 mg | Freq: Once | INTRAMUSCULAR | Status: AC
  Administered 2014-04-16: 4 mg via INTRAVENOUS
  Filled 2014-04-16: qty 1

## 2014-04-16 MED ORDER — MORPHINE SULFATE 2 MG/ML IJ SOLN
2.0000 mg | INTRAMUSCULAR | Status: DC | PRN
  Administered 2014-04-17: 2 mg via INTRAVENOUS
  Filled 2014-04-16: qty 1

## 2014-04-16 MED ORDER — ENOXAPARIN SODIUM 40 MG/0.4ML ~~LOC~~ SOLN
40.0000 mg | Freq: Every day | SUBCUTANEOUS | Status: DC
  Administered 2014-04-16 – 2014-04-17 (×2): 40 mg via SUBCUTANEOUS
  Filled 2014-04-16 (×4): qty 0.4

## 2014-04-16 MED ORDER — ACETAMINOPHEN 325 MG PO TABS: 650.0000 mg | ORAL_TABLET | ORAL | Status: DC | PRN

## 2014-04-16 MED ORDER — ALPRAZOLAM 0.25 MG PO TABS: 0.2500 mg | ORAL_TABLET | Freq: Two times a day (BID) | ORAL | Status: DC | PRN

## 2014-04-16 MED ORDER — ASPIRIN 81 MG PO CHEW
324.0000 mg | CHEWABLE_TABLET | Freq: Once | ORAL | Status: AC
  Administered 2014-04-16: 324 mg via ORAL
  Filled 2014-04-16: qty 4

## 2014-04-16 MED ORDER — ASPIRIN EC 81 MG PO TBEC
81.0000 mg | DELAYED_RELEASE_TABLET | Freq: Every day | ORAL | Status: DC
  Administered 2014-04-17 – 2014-04-18 (×2): 81 mg via ORAL
  Filled 2014-04-16 (×2): qty 1

## 2014-04-16 MED ORDER — NITROGLYCERIN 0.4 MG SL SUBL
0.4000 mg | SUBLINGUAL_TABLET | SUBLINGUAL | Status: DC | PRN
  Administered 2014-04-16 (×2): 0.4 mg via SUBLINGUAL
  Filled 2014-04-16: qty 1

## 2014-04-16 NOTE — ED Notes (Signed)
Pt report chest pain , hx of "mild heart attack" per pt. Pt is Sob and dizz. Denies nausea. Pt alert and oriented. Pt appears weak and lethargic.

## 2014-04-16 NOTE — H&P (Signed)
[facility] History and Physical  Patient: Cameron Osborn  ZOX:[id number]  DOB: [birth year]  DOS: the patient was seen and examined on 04/16/2014 PCP: No PCP Per Patient  Chief Complaint: Chest pain  HPI: Cameron Osborn is a 38 y.o. male with Past medical history of asthma. The patient presented with complaints of chest pain. He mentions that he was going at a funeral during the day and right when he was about to have his dinner he started having complaints of substernal throbbing chest pain which was associated with shortness of breath and sweating. He also had a sense of nausea. Symptoms lasted for 45 minutes to an hour. He drove back here and after receiving nitroglycerin the pain eased and after receiving IV morphine pain subsided. He denies any similar pain on and off in the last few months. In September 2014 he had similar event at which time he was taken [facility] and he was taken to cath lab within 5 minutes of his arrival to ER. He is cardiac catheterization was normal as per the patient and he was on aspirin and Plavix. He stopped taking Plavix placed on GI recommendations after having episodes of nausea vomiting and acid reflux. He stopped taking aspirin few months ago. [place] ago. Denies any alcohol abuse drug abuse.  The patient is coming from home. And at his baseline independent for most of his ADL.  Review of Systems: as mentioned in the history of present illness.  A Comprehensive review of the other systems is negative.  Past Medical History  Diagnosis Date  . Asthma   . Coronary artery disease   . MI (myocardial infarction)    Past Surgical History  Procedure Laterality Date  . Cardiac catheterization    . Esophagogastroduodenoscopy N/A 06/09/2013    Procedure: ESOPHAGOGASTRODUODENOSCOPY (EGD);  Surgeon: Hart Carwinora M Brodie, MD;  [facility];  Service: Endoscopy;  Laterality: N/A;   Social History:   reports that he has never smoked. He has never used smokeless tobacco. He reports that he drinks alcohol. He reports that he does not use illicit drugs.  No Known Allergies  Family History  Problem Relation Age of Onset  . Stroke Father   . Hypertension Father   . Hypertension Brother     Prior to Admission medications   Medication Sig Start Date End Date Taking? Authorizing Provider  ondansetron (ZOFRAN ODT) 4 MG disintegrating tablet Take 1 tablet (4 mg total) by mouth every 8 (eight) hours as needed for nausea. 03/29/13   Rolland PorterMark James, MD    Physical Exam: Filed Vitals:   04/16/14 1754 04/16/14 1900 04/16/14 2105 04/16/14 2250  BP: 157/83 156/79 146/74 157/74  Pulse: 125 117 115 90  Temp: 98.4 F (36.9 C)     TempSrc: Oral     Resp: 22 18 22 21   SpO2: 100% 100% 100% 100%    General: Alert, Awake and Oriented to Time, Place and Person. Appear in mild distress Eyes: PERRL ENT: Oral Mucosa clear moist. Neck: no JVD Cardiovascular: S1 and S2 Present, no Murmur, Peripheral Pulses Present Respiratory: Bilateral Air entry equal and Decreased, Clear to Auscultation, noCrackles, no wheezes Abdomen: Bowel Sound present, Soft and no tender Skin: no Rash Extremities: no Pedal edema, o calf tenderness Neurologic: Grossly no focal neuro deficit.  Labs on Admission:  CBC:  Recent Labs Lab 04/16/14 1818  WBC 4.1  HGB 15.3  HCT 43.8  MCV 82.0  PLT  200    CMP     Component Value Date/Time   NA 139 04/16/2014 1818   K 3.5* 04/16/2014 1818   CL 101 04/16/2014 1818   CO2 23 04/16/2014 1818   GLUCOSE 95 04/16/2014 1818   BUN 11 04/16/2014 1818   CREATININE 0.79 04/16/2014 1818   CALCIUM 9.5 04/16/2014 1818   PROT 6.6 03/29/2013 1250   ALBUMIN 3.6 03/29/2013 1250   AST 20 03/29/2013 1250   ALT 47 03/29/2013 1250   ALKPHOS 88 03/29/2013 1250   BILITOT 0.4 03/29/2013 1250   GFRNONAA >90 04/16/2014 1818   GFRAA >90 04/16/2014 1818    No results for input(s): LIPASE,  AMYLASE in the last 168 hours. No results for input(s): AMMONIA in the last 168 hours.  No results for input(s): CKTOTAL, CKMB, CKMBINDEX, TROPONINI in the last 168 hours. BNP (last 3 results) No results for input(s): PROBNP in the last 8760 hours.  Radiological Exams on Admission: Dg Chest Port 1 View  04/16/2014   CLINICAL DATA:  Chest pain which started this afternoon. History of prior heart attack. Shortness of breath and dizzy.  EXAM: PORTABLE CHEST - 1 VIEW  COMPARISON:  None.  FINDINGS: Normal cardiac silhouette and mediastinal contours. No focal parenchymal opacities. No pleural effusion or pneumothorax. No evidence of edema. No acute osseus abnormalities.  IMPRESSION: No acute cardiopulmonary disease.   Electronically Signed   By: Simonne ComeJohn  Watts M.D.   On: 04/16/2014 19:48    EKG: Independently reviewed. sinus tachycardia, J-point elevation.  Assessment/Plan Principal Problem:   Chest pain Active Problems:   Sinus tachycardia   Abnormal EKG   1. Chest pain The patient is presenting with complaints of chest pain, shortness of breath. He is found to have sinus tachycardia with the J-point elevation. He's EKG is unchanged from December 2014. The EKG was also reviewed with the cardiology fellow on call who ruled out any ST elevation MI on at present. Patient had similar presentation a year ago and was taken [facility] at present we do not have any access to the records.  With this the patient will currently being admitted in the hospital for telemetry observation to rule out ACS. I would obtain serial troponin and also echocardiogram in the morning. I would check a d-dimer due to his history of recent moving as well as sinus tachycardia and shortness of breath being his primary symptoms. I will keep him nothing by mouth after midnight for possible stress test in the morning. At 81 mg aspirin and Lopressor 25 mg. Formal cardiology consultation in morning    Advance goals of care discussion: Full code   Consults: Phone consultation with cardiology fellow  DVT Prophylaxis: subcutaneous Heparin Nutrition: Nothing by mouth after midnight  Disposition: Admitted to observation in telemetry unit.  Author: Lynden OxfordPranav Loy Little, MD [facility] Pager: [phone] 04/16/2014, 11:46 PM    If 7PM-7AM, please contact night-coverage [email or web address] Password [email or web address]

## 2014-04-16 NOTE — ED Provider Notes (Signed)
CSN: [id number]     Arrival date & time 04/16/14  1746 History   First MD Initiated Contact with Patient 04/16/14 1814     Chief Complaint  Patient presents with  . Chest Pain     (Consider location/radiation/quality/duration/timing/severity/associated sxs/prior Treatment) Patient is a 38 y.o. male presenting with chest pain. The history is provided by the patient.  Chest Pain Pain location:  Substernal area Pain quality: aching, dull and tightness   Pain radiates to:  Does not radiate Pain radiates to the back: no   Pain severity:  Moderate Onset quality:  Sudden Duration:  1 hour Timing:  Constant Progression:  Unchanged Chronicity:  New Context: breathing   Context comment:  Pt went to a funeral today and pain started shortly after eating.  worse with deep breathing Relieved by:  Nothing Worsened by:  Nothing tried Ineffective treatments:  None tried Associated symptoms: diaphoresis, dizziness and shortness of breath   Associated symptoms: no abdominal pain, no anorexia, no back pain, no cough, no fever, no lower extremity edema, no nausea, no near-syncope, no palpitations, not vomiting and no weakness   Risk factors: coronary artery disease   Risk factors: no diabetes mellitus, no prior DVT/PE and no smoking   Risk factors comment:  About 1 year ago when to [facility] and had an MI but neg cath.   Past Medical History  Diagnosis Date  . Asthma   . Coronary artery disease   . MI (myocardial infarction)    Past Surgical History  Procedure Laterality Date  . Cardiac catheterization    . Esophagogastroduodenoscopy N/A 06/09/2013    Procedure: ESOPHAGOGASTRODUODENOSCOPY (EGD);  Surgeon: Hart Carwinora M Brodie, MD;  Location: [facility];  Service: Endoscopy;  Laterality: N/A;   Family History  Problem Relation Age of Onset  . Stroke Father   . Hypertension Father   . Hypertension Brother    History  Substance Use Topics  . Smoking status: Never Smoker   .  Smokeless tobacco: Never Used  . Alcohol Use: Yes     Comment: socially    Review of Systems  Constitutional: Positive for diaphoresis. Negative for fever.  Respiratory: Positive for shortness of breath. Negative for cough.   Cardiovascular: Positive for chest pain. Negative for palpitations and near-syncope.  Gastrointestinal: Negative for nausea, vomiting, abdominal pain and anorexia.  Musculoskeletal: Negative for back pain.  Neurological: Positive for dizziness. Negative for weakness.  All other systems reviewed and are negative.     Allergies  Review of patient's allergies indicates no known allergies.  Home Medications   Prior to Admission medications   Medication Sig Start Date End Date Taking? Authorizing Provider  aspirin EC 81 MG tablet Take 81 mg by mouth daily.    Historical Provider, MD  nitroGLYCERIN (NITROSTAT) 0.4 MG SL tablet Place 0.4 mg under the tongue every 5 (five) minutes as needed for chest pain (chest pain).    Historical Provider, MD  omeprazole (PRILOSEC) 20 MG capsule Take 20 mg by mouth daily. 03/29/13   Rolland PorterMark James, MD  ondansetron (ZOFRAN ODT) 4 MG disintegrating tablet Take 1 tablet (4 mg total) by mouth every 8 (eight) hours as needed for nausea. 03/29/13   Rolland PorterMark James, MD   BP 157/83 mmHg  Pulse 125  Temp(Src) 98.4 F (36.9 C) (Oral)  Resp 22  SpO2 100% Physical Exam  Constitutional: He is oriented to person, place, and time. He appears well-developed and well-nourished. He appears distressed.  HENT:  Head:  Normocephalic and atraumatic.  Mouth/Throat: Oropharynx is clear and moist.  Eyes: Conjunctivae and EOM are normal. Pupils are equal, round, and reactive to light.  Neck: Normal range of motion. Neck supple.  Cardiovascular: Regular rhythm and intact distal pulses.  Tachycardia present.   No murmur heard. Pulmonary/Chest: Effort normal and breath sounds normal. No respiratory distress. He has no wheezes. He has no rales. He exhibits  tenderness.    Abdominal: Soft. He exhibits no distension. There is no tenderness. There is no rebound and no guarding.  Musculoskeletal: Normal range of motion. He exhibits no edema or tenderness.  Neurological: He is alert and oriented to person, place, and time.  Skin: Skin is warm. No rash noted. He is diaphoretic. No erythema.  Psychiatric: He has a normal mood and affect. His behavior is normal.  Nursing note and vitals reviewed.   ED Course  Procedures (including critical care time) Labs Review Labs Reviewed  BASIC METABOLIC PANEL - Abnormal; Notable for the following:    Potassium 3.5 (*)    All other components within normal limits  CBC  I-STAT TROPOININ, ED    Imaging Review Dg Chest Port 1 View  04/16/2014   CLINICAL DATA:  Chest pain which started this afternoon. History of prior heart attack. Shortness of breath and dizzy.  EXAM: PORTABLE CHEST - 1 VIEW  COMPARISON:  None.  FINDINGS: Normal cardiac silhouette and mediastinal contours. No focal parenchymal opacities. No pleural effusion or pneumothorax. No evidence of edema. No acute osseus abnormalities.  IMPRESSION: No acute cardiopulmonary disease.   Electronically Signed   By: Simonne ComeJohn  Watts M.D.   On: 04/16/2014 19:48     EKG Interpretation   Date/Time:  Monday April 16 2014 17:51:17 EST Ventricular Rate:  118 PR Interval:  116 QRS Duration: 89 QT Interval:  303 QTC Calculation: 424 R Axis:   80 Text Interpretation:  Sinus tachycardia Left ventricular hypertrophy ST  elevation, consider anterior injury No significant change since last  tracing Confirmed by KNAPP  MD-J, JON ([id number]) on 04/16/2014 6:10:51 PM      MDM   Final diagnoses:  Chest pain    Pt with symptoms concerning for ACS with centralized CP that does have pleuritic component with SOB, diaphoresis, tachycardia without radiation.  Prior hx of MI about 1 year ago but neg cath.  Off all meds at this time.  Denies drug or alcohol use.  ASA and  NTG given.  States started after going to a funeral and eating however states doesn't feel like prior GERD and has not had any stomach issues lately.    EKG with sinus tachy and early repol but no acute changes.   CXR, CBC, BMP, trop pending.  8:27 PM Labs wnl.  CXR wnl.  Pain is down to 3/10 from 7/10 after NTG.  Will give morphine to get pt pain free.  Diaphoresis has subsided.  Will admit for r/o.  9:24 PM After morphine pt is pain free.  Repeat EKG pending.  Gwyneth SproutWhitney Erico Stan, MD 04/16/14 2125

## 2014-04-17 ENCOUNTER — Encounter (HOSPITAL_COMMUNITY): Payer: Self-pay

## 2014-04-17 ENCOUNTER — Observation Stay (HOSPITAL_COMMUNITY): Payer: Self-pay

## 2014-04-17 DIAGNOSIS — R9431 Abnormal electrocardiogram [ECG] [EKG]: Secondary | ICD-10-CM | POA: Diagnosis present

## 2014-04-17 DIAGNOSIS — R000 Tachycardia, unspecified: Secondary | ICD-10-CM | POA: Diagnosis present

## 2014-04-17 LAB — CK TOTAL AND CKMB (NOT AT ARMC)
CK, MB: 1.3 ng/mL (ref 0.3–4.0)
Relative Index: INVALID (ref 0.0–2.5)
Total CK: 67 U/L (ref 7–232)

## 2014-04-17 LAB — TROPONIN I: Troponin I: <0.3 ng/mL (ref <0.30)

## 2014-04-17 MED ORDER — POTASSIUM CHLORIDE CRYS ER 10 MEQ PO TBCR
10.0000 meq | EXTENDED_RELEASE_TABLET | Freq: Two times a day (BID) | ORAL | Status: DC
  Administered 2014-04-17 – 2014-04-18 (×2): 10 meq via ORAL
  Filled 2014-04-17 (×2): qty 1

## 2014-04-17 MED ORDER — IOHEXOL 350 MG/ML SOLN
100.0000 mL | Freq: Once | INTRAVENOUS | Status: AC | PRN
  Administered 2014-04-17: 100 mL via INTRAVENOUS

## 2014-04-17 MED ORDER — PANTOPRAZOLE SODIUM 40 MG PO TBEC
40.0000 mg | DELAYED_RELEASE_TABLET | Freq: Every day | ORAL | Status: DC
  Administered 2014-04-17: 40 mg via ORAL
  Filled 2014-04-17 (×2): qty 1

## 2014-04-17 MED ORDER — GI COCKTAIL ~~LOC~~
30.0000 mL | Freq: Every day | ORAL | Status: DC | PRN
  Administered 2014-04-17: 30 mL via ORAL
  Filled 2014-04-17 (×2): qty 30

## 2014-04-17 NOTE — Plan of Care (Signed)
Problem: Phase I Progression Outcomes Goal: Hemodynamically stable Outcome: Progressing Goal: Voiding-avoid urinary catheter unless indicated Outcome: Completed/Met Date Met:  04/17/14

## 2014-04-17 NOTE — Consult Note (Signed)
Referring Physician: Dr. Noralee Stain is an 38 y.o. male.                       Chief Complaint: Chest pain  HPI: 38 years old male with substernal chest pain with sweating and shortness of breath. Pain improves with NTG and GI coktail. Some pain with deep breathing also. Off aspirin and Plavix due to acid reflux. Cardiac cath in September 2014 was unremarkable except decreased EF. Echocardiogram today shows mild LV systolic dysfunction and mild MR.  Past Medical History  Diagnosis Date  . Asthma   . Coronary artery disease   . MI (myocardial infarction)       Past Surgical History  Procedure Laterality Date  . Cardiac catheterization    . Esophagogastroduodenoscopy N/A 06/09/2013    Procedure: ESOPHAGOGASTRODUODENOSCOPY (EGD);  Surgeon: Lafayette Dragon, MD;  Location: [facility];  Service: Endoscopy;  Laterality: N/A;    Family History  Problem Relation Age of Onset  . Stroke Father   . Hypertension Father   . Hypertension Brother    Social History:  reports that he has never smoked. He has never used smokeless tobacco. He reports that he drinks alcohol. He reports that he does not use illicit drugs.  Allergies: No Known Allergies  Medications Prior to Admission  Medication Sig Dispense Refill  . ondansetron (ZOFRAN ODT) 4 MG disintegrating tablet Take 1 tablet (4 mg total) by mouth every 8 (eight) hours as needed for nausea. 20 tablet 0    Results for orders placed or performed during the hospital encounter of 04/16/14 (from the past 48 hour(s))  CBC     Status: None   Collection Time: 04/16/14  6:18 PM  Result Value Ref Range   WBC 4.1 4.0 - 10.5 K/uL   RBC 5.34 4.22 - 5.81 MIL/uL   Hemoglobin 15.3 13.0 - 17.0 g/dL   HCT 43.8 39.0 - 52.0 %   MCV 82.0 78.0 - 100.0 fL   MCH 28.7 26.0 - 34.0 pg   MCHC 34.9 30.0 - 36.0 g/dL   RDW 13.1 11.5 - 15.5 %   Platelets 200 150 - 400 K/uL  Basic metabolic panel     Status: Abnormal   Collection Time: 04/16/14   6:18 PM  Result Value Ref Range   Sodium 139 137 - 147 mEq/L   Potassium 3.5 (L) 3.7 - 5.3 mEq/L   Chloride 101 96 - 112 mEq/L   CO2 23 19 - 32 mEq/L   Glucose, Bld 95 70 - 99 mg/dL   BUN 11 6 - 23 mg/dL   Creatinine, Ser 0.79 0.50 - 1.35 mg/dL   Calcium 9.5 8.4 - 10.5 mg/dL   GFR calc non Af Amer >90 >90 mL/min   GFR calc Af Amer >90 >90 mL/min    Comment: (NOTE) The eGFR has been calculated using the CKD EPI equation. This calculation has not been validated in all clinical situations. eGFR's persistently <90 mL/min signify possible Chronic Kidney Disease.    Anion gap 15 5 - 15  I-stat troponin, ED (not at [facility])     Status: None   Collection Time: 04/16/14  6:32 PM  Result Value Ref Range   Troponin i, poc 0.00 0.00 - 0.08 ng/mL   Comment 3            Comment: Due to the release kinetics of cTnI, a negative result within the first hours of the onset  of symptoms does not rule out myocardial infarction with certainty. If myocardial infarction is still suspected, repeat the test at appropriate intervals.   D-dimer, quantitative     Status: Abnormal   Collection Time: 04/16/14 11:01 PM  Result Value Ref Range   D-Dimer, Quant 0.70 (H) 0.00 - 0.48 ug/mL-FEU    Comment:        AT THE INHOUSE ESTABLISHED CUTOFF VALUE OF 0.48 ug/mL FEU, THIS ASSAY HAS BEEN DOCUMENTED IN THE LITERATURE TO HAVE A SENSITIVITY AND NEGATIVE PREDICTIVE VALUE OF AT LEAST 98 TO 99%.  THE TEST RESULT SHOULD BE CORRELATED WITH AN ASSESSMENT OF THE CLINICAL PROBABILITY OF DVT / VTE.    CK total and CKMB (cardiac)     Status: None   Collection Time: 04/16/14 11:01 PM  Result Value Ref Range   Total CK 67 7 - 232 U/L   CK, MB 1.3 0.3 - 4.0 ng/mL   Relative Index RELATIVE INDEX IS INVALID 0.0 - 2.5    Comment: WHEN CK < 100 U/L        Performed at [facility]   Troponin I-serum (one time only)     Status: None   Collection Time: 04/16/14 11:01 PM  Result Value Ref Range   Troponin I  <0.30 <0.30 ng/mL    Comment:        Due to the release kinetics of cTnI, a negative result within the first hours of the onset of symptoms does not rule out myocardial infarction with certainty. If myocardial infarction is still suspected, repeat the test at appropriate intervals.   Troponin I-serum (one time only)     Status: None   Collection Time: 04/17/14  5:10 AM  Result Value Ref Range   Troponin I <0.30 <0.30 ng/mL    Comment:        Due to the release kinetics of cTnI, a negative result within the first hours of the onset of symptoms does not rule out myocardial infarction with certainty. If myocardial infarction is still suspected, repeat the test at appropriate intervals.   Troponin I-serum (one time only)     Status: None   Collection Time: 04/17/14 10:35 AM  Result Value Ref Range   Troponin I <0.30 <0.30 ng/mL    Comment:        Due to the release kinetics of cTnI, a negative result within the first hours of the onset of symptoms does not rule out myocardial infarction with certainty. If myocardial infarction is still suspected, repeat the test at appropriate intervals.    Ct Angio Chest Pe W/cm &/or Wo Cm  04/17/2014   CLINICAL DATA:  Acute onset of substernal chest pain. Pain worsens with deep breath. Current history of moderate asthma. Elevated D-dimer. Initial encounter.  EXAM: CT ANGIOGRAPHY CHEST WITH CONTRAST  TECHNIQUE: Multidetector CT imaging of the chest was performed using the standard protocol during bolus administration of intravenous contrast. Multiplanar CT image reconstructions and MIPs were obtained to evaluate the vascular anatomy.  CONTRAST:  178m OMNIPAQUE IOHEXOL 350 MG/ML SOLN  COMPARISON:  Chest radiograph performed earlier today at 7:18 p.m.  FINDINGS: There is no evidence of pulmonary embolus.  There is a small 4 mm pleural based nodule within the right upper lobe (image 40 of 102). The lungs are otherwise clear. There is no evidence of  significant focal consolidation, pleural effusion or pneumothorax. No masses are identified; no abnormal focal contrast enhancement is seen.  The mediastinum is unremarkable in  appearance. Residual thymic tissue is somewhat prominent but likely within normal limits. No mediastinal lymphadenopathy is seen. No pericardial effusion is identified. No axillary lymphadenopathy is seen. The thyroid gland is mildly enlarged and heterogeneous, raising question for thyroid goiter.  The visualized portions of the liver and spleen are unremarkable. The visualized portions of the pancreas, stomach, adrenal glands and kidneys are within normal limits.  No acute osseous abnormalities are seen.  Review of the MIP images confirms the above findings.  IMPRESSION: 1. No evidence of pulmonary embolus. 2. Small 4 mm pleural-based nodule at the right upper lobe. Lungs otherwise clear. If the patient is at high risk for bronchogenic carcinoma, follow-up chest CT at 1 year is recommended. If the patient is at low risk, no follow-up is needed. This recommendation follows the consensus statement: Guidelines for Management of Small Pulmonary Nodules Detected on CT Scans: A Statement from the [place] as published in Radiology 2005; 237:395-400. 3. Mildly enlarged and heterogeneous appearance to thyroid gland, raising question for thyroid goiter. Would correlate with lab values.   Electronically Signed   By: Garald Balding M.D.   On: 04/17/2014 02:28   Dg Chest Port 1 View  04/16/2014   CLINICAL DATA:  Chest pain which started this afternoon. History of prior heart attack. Shortness of breath and dizzy.  EXAM: PORTABLE CHEST - 1 VIEW  COMPARISON:  None.  FINDINGS: Normal cardiac silhouette and mediastinal contours. No focal parenchymal opacities. No pleural effusion or pneumothorax. No evidence of edema. No acute osseus abnormalities.  IMPRESSION: No acute cardiopulmonary disease.   Electronically Signed   By: Sandi Mariscal M.D.    On: 04/16/2014 19:48    Review Of Systems Constitutional: Positive for diaphoresis. Negative for fever.  Respiratory: Positive for shortness of breath. Negative for cough.  Cardiovascular: Positive for chest pain. Negative for palpitations and near-syncope.  Gastrointestinal: Negative for nausea, vomiting, abdominal pain and anorexia.  Musculoskeletal: Negative for back pain.  Neurological: Positive for dizziness. Negative for weakness.  All other systems reviewed and are negative.  Blood pressure 109/69, pulse 85, temperature 98 F (36.7 C), temperature source Oral, resp. rate 20, height _0  (1.753 m), weight 70.852 kg (156 lb 3.2 oz), SpO2 100 %.  Physical Exam  Constitutional: He appears well-developed and well-nourished.  HENT: Normocephalic and atraumatic. Oropharynx is clear and moist.  Eyes: Conjunctivae and EOM are normal. Pupils are equal, round, and reactive to light.  Neck: Normal range of motion. Neck supple.  Cardiovascular: Regular rhythm and intact distal pulses. II/VI systolic murmur heard. Pulmonary/Chest: Effort normal and breath sounds normal.   Abdominal: Soft and non-tender.  Musculoskeletal: Normal range of motion. He exhibits no edema or tenderness.  Neurological: He is alert and oriented to person, place, and time. Moves all 4 extremities Skin: Skin is warm. No rash noted.   Psychiatric: He has a normal mood and affect. His behavior is normal.   Assessment/Plan Chest pain CAD Asthma GERD Alcohol use disorder  Nuclear stress test in AM  [facility] S, MD  04/17/2014, 3:52 PM

## 2014-04-17 NOTE — Progress Notes (Signed)
UR completed 

## 2014-04-17 NOTE — Progress Notes (Signed)
[facility] PROGRESS NOTE  Cameron NapoleonClarence R Osborn NFA:[id number] DOB: July 05, 1975 DOA: 04/16/2014 PCP: No PCP Per Patient Interim summary: 38 year old male admitted for chest pain. He had similar complaints last year 01/2013 and he underwent cardiac [place] at [place]. He was put on aspirin and plavix , but the cardiologist had to stop the medications in view of his GERD symptoms.  He is admitted to medical service for evaluation of ACS.  Assessment/Plan:                                                                                                           Atypical chest pain: probably GI in origin. He might benefit from GI eval if nuclear stress is negative.  Cardiac enzymes are negative EKG doe snot show ischemic changes. Echo pending. Cardiology consulted.    Code Status: full code.  Family Communication: none at bedside Disposition Plan: pending.    Consultants:  Cardiology consulted.   Procedures:  Nuclear stress in am  Antibiotics:  none  HPI/Subjective: Chest pain imrpoved.   Objective: Filed Vitals:   04/17/14 1712  BP: 134/67  Pulse: 75  Temp: 98.2 F (36.8 C)  Resp: 20   No intake or output data in the 24 hours ending 04/17/14 1745 Filed Weights   04/17/14 0002  Weight: 70.852 kg (156 lb 3.2 oz)    Exam:   General:  Alert afebrile comfortable  Cardiovascular: s1s2, no mrg,   Respiratory: CTAB  Abdomen: soft non tender non distended bowel sounds heard.   Musculoskeletal: no pedal edema.   Data Reviewed: Basic Metabolic Panel:  Recent Labs Lab 04/16/14 1818  NA 139  K 3.5*  CL 101  CO2 23  GLUCOSE 95  BUN 11  CREATININE 0.79  CALCIUM 9.5   Liver Function Tests: No results for input(s): AST, ALT, ALKPHOS, BILITOT, PROT, ALBUMIN in the last 168 hours. No results for input(s): LIPASE, AMYLASE in the last 168 hours. No results for input(s): AMMONIA in the last 168 hours. CBC:  Recent Labs Lab 04/16/14 1818  WBC 4.1  HGB  15.3  HCT 43.8  MCV 82.0  PLT 200   Cardiac Enzymes:  Recent Labs Lab 04/16/14 2301 04/17/14 0510 04/17/14 1035  CKTOTAL 67  --   --   CKMB 1.3  --   --   TROPONINI <0.30 <0.30 <0.30   BNP (last 3 results) No results for input(s): PROBNP in the last 8760 hours. CBG: No results for input(s): GLUCAP in the last 168 hours.  No results found for this or any previous visit (from the past 240 hour(s)).   Studies: Ct Angio Chest Pe W/cm &/or Wo Cm  04/17/2014   CLINICAL DATA:  Acute onset of substernal chest pain. Pain worsens with deep breath. Current history of moderate asthma. Elevated D-dimer. Initial encounter.  EXAM: CT ANGIOGRAPHY CHEST WITH CONTRAST  TECHNIQUE: Multidetector CT imaging of the chest was performed using the standard protocol during bolus administration of intravenous contrast. Multiplanar CT image reconstructions and MIPs were obtained to evaluate  the vascular anatomy.  CONTRAST:  100mL OMNIPAQUE IOHEXOL 350 MG/ML SOLN  COMPARISON:  Chest radiograph performed earlier today at 7:18 p.m.  FINDINGS: There is no evidence of pulmonary embolus.  There is a small 4 mm pleural based nodule within the right upper lobe (image 40 of 102). The lungs are otherwise clear. There is no evidence of significant focal consolidation, pleural effusion or pneumothorax. No masses are identified; no abnormal focal contrast enhancement is seen.  The mediastinum is unremarkable in appearance. Residual thymic tissue is somewhat prominent but likely within normal limits. No mediastinal lymphadenopathy is seen. No pericardial effusion is identified. No axillary lymphadenopathy is seen. The thyroid gland is mildly enlarged and heterogeneous, raising question for thyroid goiter.  The visualized portions of the liver and spleen are unremarkable. The visualized portions of the pancreas, stomach, adrenal glands and kidneys are within normal limits.  No acute osseous abnormalities are seen.  Review of the MIP  images confirms the above findings.  IMPRESSION: 1. No evidence of pulmonary embolus. 2. Small 4 mm pleural-based nodule at the right upper lobe. Lungs otherwise clear. If the patient is at high risk for bronchogenic carcinoma, follow-up chest CT at 1 year is recommended. If the patient is at low risk, no follow-up is needed. This recommendation follows the consensus statement: Guidelines for Management of Small Pulmonary Nodules Detected on CT Scans: A Statement from the Fleischner Society as published in Radiology 2005; 237:395-400. 3. Mildly enlarged and heterogeneous appearance to thyroid gland, raising question for thyroid goiter. Would correlate with lab values.   Electronically Signed   By: Roanna RaiderJeffery  Chang M.D.   On: 04/17/2014 02:28   Dg Chest Port 1 View  04/16/2014   CLINICAL DATA:  Chest pain which started this afternoon. History of prior heart attack. Shortness of breath and dizzy.  EXAM: PORTABLE CHEST - 1 VIEW  COMPARISON:  None.  FINDINGS: Normal cardiac silhouette and mediastinal contours. No focal parenchymal opacities. No pleural effusion or pneumothorax. No evidence of edema. No acute osseus abnormalities.  IMPRESSION: No acute cardiopulmonary disease.   Electronically Signed   By: Simonne ComeJohn  Watts M.D.   On: 04/16/2014 19:48    Scheduled Meds: . aspirin EC  81 mg Oral Daily  . enoxaparin (LOVENOX) injection  40 mg Subcutaneous QHS  . metoprolol tartrate  25 mg Oral BID  . pantoprazole  40 mg Oral Q0600  . potassium chloride  10 mEq Oral BID   Continuous Infusions:   Principal Problem:   Chest pain Active Problems:   Sinus tachycardia   Abnormal EKG    Time spent: 25 minutes.     [facility] Surgery CenterKULA,Kenyanna Grzesiak  [facility] Pager [phone]. If 7PM-7AM, please contact night-coverage at [email or web address], password [email or web address] 04/17/2014, 5:45 PM  LOS: 1 day

## 2014-04-17 NOTE — Progress Notes (Signed)
Echocardiogram 2D Echocardiogram has been performed.  [name], [name] 04/17/2014, 1:33 PM

## 2014-04-17 NOTE — Progress Notes (Signed)
Patient C/O right sided chest pain, rated pain 3 out of 10 pain scale. PRN morphine and GI cocktail given and pain resolved in less than 30mins, Dr. Blake DivineAkula notified and in room assessing patient. No new order given. Will continue to assess patient.

## 2014-04-18 ENCOUNTER — Ambulatory Visit (HOSPITAL_COMMUNITY): Payer: BC Managed Care – PPO

## 2014-04-18 DIAGNOSIS — K219 Gastro-esophageal reflux disease without esophagitis: Secondary | ICD-10-CM

## 2014-04-18 DIAGNOSIS — R7989 Other specified abnormal findings of blood chemistry: Secondary | ICD-10-CM

## 2014-04-18 DIAGNOSIS — E876 Hypokalemia: Secondary | ICD-10-CM

## 2014-04-18 LAB — TSH: TSH: <0.005 u[IU]/mL — ABNORMAL LOW (ref 0.350–4.500)

## 2014-04-18 LAB — T4, FREE: Free T4: 3.95 ng/dL — ABNORMAL HIGH (ref 0.80–1.80)

## 2014-04-18 LAB — T3, FREE: T3, Free: 18.6 pg/mL — ABNORMAL HIGH (ref 2.3–4.2)

## 2014-04-18 MED ORDER — OMEPRAZOLE 20 MG PO CPDR: 20.0000 mg | DELAYED_RELEASE_CAPSULE | Freq: Every day | ORAL | Status: AC

## 2014-04-18 MED ORDER — REGADENOSON 0.4 MG/5ML IV SOLN
0.4000 mg | Freq: Once | INTRAVENOUS | Status: AC
  Administered 2014-04-18: 0.4 mg via INTRAVENOUS
  Filled 2014-04-18: qty 5

## 2014-04-18 MED ORDER — METOPROLOL TARTRATE 25 MG PO TABS: 25.0000 mg | ORAL_TABLET | Freq: Two times a day (BID) | ORAL | Status: AC

## 2014-04-18 MED ORDER — ASPIRIN 81 MG PO TBEC: 81.0000 mg | DELAYED_RELEASE_TABLET | Freq: Every day | ORAL | Status: AC

## 2014-04-18 MED ORDER — TECHNETIUM TC 99M SESTAMIBI GENERIC - CARDIOLITE
10.0000 | Freq: Once | INTRAVENOUS | Status: AC | PRN
  Administered 2014-04-18: 10 via INTRAVENOUS

## 2014-04-18 MED ORDER — TECHNETIUM TC 99M SESTAMIBI GENERIC - CARDIOLITE
30.0000 | Freq: Once | INTRAVENOUS | Status: AC | PRN
  Administered 2014-04-18: 30 via INTRAVENOUS

## 2014-04-18 NOTE — Plan of Care (Signed)
Problem: Phase I Progression Outcomes Goal: Hemodynamically stable Outcome: Completed/Met Date Met:  04/18/14 Goal: Anginal pain relieved Outcome: Completed/Met Date Met:  04/18/14

## 2014-04-18 NOTE — Progress Notes (Signed)
UR completed 

## 2014-04-18 NOTE — Consult Note (Signed)
Ref: No PCP Per Patient   Subjective:  Feeling better. Passed nuclear stress test.  Objective:  Vital Signs in the last 24 hours: Temp:  [97.9 F (36.6 C)-99 F (37.2 C)] 98.2 F (36.8 C) (12/02 1257) Pulse Rate:  [75-105] 86 (12/02 1257) Cardiac Rhythm:  [-] Normal sinus rhythm (12/01 1930) Resp:  [20] 20 (12/02 0607) BP: (119-138)/(62-78) 121/72 mmHg (12/02 1257) SpO2:  [100 %] 100 % (12/02 1257)  Physical Exam: BP Readings from Last 1 Encounters:  04/18/14 121/72    Wt Readings from Last 1 Encounters:  04/17/14 70.852 kg (156 lb 3.2 oz)    Weight change:   HEENT: [place]/AT, Eyes-Brown, PERL, EOMI, Conjunctiva-Pink, Sclera-Non-icteric Neck: No JVD, No bruit, Trachea midline. Lungs:  Clear, Bilateral. Cardiac:  Regular rhythm, normal S1 and S2, no S3.  Abdomen:  Soft, non-tender. Extremities:  No edema present. No cyanosis. No clubbing. CNS: AxOx3, Cranial nerves grossly intact, moves all 4 extremities.  Skin: Warm and dry.   Intake/Output from previous day: 12/01 0701 - 12/02 0700 In: 240 [P.O.:240] Out: 2 [Urine:2]    Lab Results: BMET    Component Value Date/Time   NA 139 04/16/2014 1818   NA 137 03/29/2013 1250   NA 136 02/14/2013 0004   K 3.5* 04/16/2014 1818   K 4.6 03/29/2013 1250   K 4.4 02/14/2013 0004   CL 101 04/16/2014 1818   CL 106 03/29/2013 1250   CL 101 02/14/2013 0004   CO2 23 04/16/2014 1818   CO2 21 03/29/2013 1250   CO2 24 02/14/2013 0004   GLUCOSE 95 04/16/2014 1818   GLUCOSE 116* 03/29/2013 1250   GLUCOSE 98 02/14/2013 0004   BUN 11 04/16/2014 1818   BUN 16 03/29/2013 1250   BUN 11 02/14/2013 0004   CREATININE 0.79 04/16/2014 1818   CREATININE 0.58 03/29/2013 1250   CREATININE 0.58 02/14/2013 0004   CALCIUM 9.5 04/16/2014 1818   CALCIUM 10.8* 03/29/2013 1250   CALCIUM 10.3 02/14/2013 0004   GFRNONAA >90 04/16/2014 1818   GFRNONAA >90 03/29/2013 1250   GFRNONAA >90 02/14/2013 0004   GFRAA >90 04/16/2014 1818   GFRAA >90  03/29/2013 1250   GFRAA >90 02/14/2013 0004   CBC    Component Value Date/Time   WBC 4.1 04/16/2014 1818   RBC 5.34 04/16/2014 1818   HGB 15.3 04/16/2014 1818   HCT 43.8 04/16/2014 1818   PLT 200 04/16/2014 1818   MCV 82.0 04/16/2014 1818   MCH 28.7 04/16/2014 1818   MCHC 34.9 04/16/2014 1818   RDW 13.1 04/16/2014 1818   LYMPHSABS 2.1 03/29/2013 1250   MONOABS 0.7 03/29/2013 1250   EOSABS 0.3 03/29/2013 1250   BASOSABS 0.0 03/29/2013 1250   HEPATIC Function Panel No results for input(s): PROT in the last 8760 hours.  Invalid input(s):  ALBUMIN,  AST,  ALT,  ALKPHOS,  BILIDIR,  IBILI HEMOGLOBIN A1C No components found for: HGA1C,  MPG CARDIAC ENZYMES Lab Results  Component Value Date   CKTOTAL 67 04/16/2014   CKMB 1.3 04/16/2014   TROPONINI <0.30 04/17/2014   TROPONINI <0.30 04/17/2014   TROPONINI <0.30 04/16/2014   BNP No results for input(s): PROBNP in the last 8760 hours. TSH  Recent Labs  04/17/14 1035  TSH <0.005*   CHOLESTEROL No results for input(s): CHOL in the last 8760 hours.  Scheduled Meds: . aspirin EC  81 mg Oral Daily  . enoxaparin (LOVENOX) injection  40 mg Subcutaneous QHS  . metoprolol tartrate  25 mg Oral BID  . pantoprazole  40 mg Oral Q0600  . potassium chloride  10 mEq Oral BID   Continuous Infusions:  PRN Meds:.acetaminophen, ALPRAZolam, gi cocktail, morphine injection, nitroGLYCERIN, ondansetron (ZOFRAN) IV  Assessment/Plan: Chest pain CAD Asthma GERD Alcohol use disorder  May discharge from Cardiology stand point.     LOS: 2 days    Orpah CobbAjay Kyrstan Gotwalt  MD  04/18/2014, 2:59 PM

## 2014-04-18 NOTE — Discharge Summary (Addendum)
Physician Discharge Summary  Cameron Osborn JXB:[id number] DOB: [birth year] DOA: 04/16/2014  PCP: Ricki Rodriguez, MD  Admit date: 04/16/2014 Discharge date: 04/18/2014  Time spent: less than 30 minutes  Recommendations for Outpatient Follow-up:  1. Dr. Orpah Cobb, PCP in 1 month- new patient. 2. Please follow TFTs-drawn in the hospital prior to discharge on 04/18/14 3. Outpatient follow-up of right upper lobe pulmonary nodule that was seen on CTA chest-with repeat CT chest in one year.  Discharge Diagnoses:  Principal Problem:   Chest pain Active Problems:   Sinus tachycardia   Abnormal EKG   Discharge Condition: Improved & Stable  Diet recommendation: Heart healthy diet.  Filed Weights   04/17/14 0002  Weight: 70.852 kg (156 lb 3.2 oz)    History of present illness and hospital course:  38 year old male with previous history of asthma, CAD, MI presented with chest pain associated with nausea, dyspnea and sweating. Symptoms lasted for 45 minutes to an hour and was resolved after nitroglycerin and IV morphine. He has had similar symptoms on and off in the last few months. In September 2014, he had a similar presentation and underwent cardiac cath which apparently was unremarkable. He was supposed to be on aspirin and Plavix but discontinued them after having episodes of nausea, vomiting and acid reflux. EKG was unremarkable. Troponin were cycled and negative. D-dimer was positive and hence was followed up with a CTA chest that was negative for pulmonary embolus but showed a small 4 mm pleural-based nodule at the right upper lobe. Cardiology was consulted and patient underwent nuclear stress test which was negative. Discussed with cardiology who have cleared him for discharge home on low-dose beta blocker and 81 MG aspirin. Patient lacks PCP and Dr. Algie Coffer has kindly agreed to see him in follow-up. Patient will also be discharged on PPI because his symptoms seem to be GI in origin  (i.e GERD). Patient's TSH is low but clinically he appears euthyroid. Will get free T4 and free T3 prior to discharge which need to be followed as outpatient. He also has a lung nodule on CT chest which has to be followed as outpatient in a year with repeat CT chest. Patient was given potassium supplements for hypokalemia.  Addendum  New Hyperthyroid  - TFTs were drawn prior to patient's discharge from the hospital on 04/18/14. TFTs are abnormal: TSH 0.006, free T4: 3.95 and free T3: 18.6. These results aren't in keeping with hyperthyroidism. Clinically patient did not appear overtly hyperthyroid. Discussed with endocrinologist Dr. Carlus Pavlov on 04/19/14 and she recommended starting him on methimazole 10 MG twice a day. He is already on BB's. Prescription for same was sent to his [facility]. She indicated that he will need a thyroid uptake scan which cannot be done for 1 month secondary to recent CTA chest and IV contrast. TFTs will need to be repeated in one month. Patient lacks insurance but she is happy to see him in consult as outpatient and he will be a self pay. She also advised of no need for follow-up CBC or LFTs after starting Methimazole, unless patient develops symptoms i.e. fever, sore throat, right upper quadrant pain, jaundice, dark urine or pale stools.   - After several attempts, was able to reach patient and explained in detail regarding his new diagnosis of hyperthyroid and possible Graves' disease. He did give history of 4 episodes of mild palpitations since September 2014. He has lost significant weight (220 pounds >156 pounds) over the last 12 months,  unintentionally. He denies excessive heat or cold intolerance. No reported diarrhea. He was provided the contact number for the endocrinologist to make a new patient appointment. He was advised to pick up his prescription for methimazole from the pharmacy and regarding side effects to watch out for in which case he is advised to stop  taking Methimazole and seek immediate medical attention.  - [facility] he can have PCP follow-up and may be able to obtain medications.    Consultations:  Cardiology  Procedures:  None    Discharge Exam:  Complaints:  No further chest pains reported. Denies nausea, vomiting, heartburn or dyspnea.  Filed Vitals:   04/18/14 1012 04/18/14 1014 04/18/14 1126 04/18/14 1257  BP: 126/78 135/75 136/73 121/72  Pulse:   105 86  Temp:   98.4 F (36.9 C) 98.2 F (36.8 C)  TempSrc:   Oral Oral  Resp:      Height:      Weight:      SpO2:   100% 100%    General exam: Pleasant young male lying comfortably in bed. Respiratory system: Clear. No increased work of breathing. Cardiovascular system: S1 & S2 heard, RRR. No JVD, murmurs, gallops, clicks or pedal edema. Telemetry: Sinus rhythm. Gastrointestinal system: Abdomen is nondistended, soft and nontender. Normal bowel sounds heard. Central nervous system: Alert and oriented. No focal neurological deficits. Extremities: Symmetric 5 x 5 power.  Discharge Instructions      Discharge Instructions    Activity as tolerated - No restrictions    Complete by:  As directed      Call MD for:  severe uncontrolled pain    Complete by:  As directed      Diet - low sodium heart healthy    Complete by:  As directed             Medication List    STOP taking these medications        ondansetron 4 MG disintegrating tablet  Commonly known as:  ZOFRAN ODT      TAKE these medications        aspirin 81 MG EC tablet  Take 1 tablet (81 mg total) by mouth daily.     metoprolol tartrate 25 MG tablet  Commonly known as:  LOPRESSOR  Take 1 tablet (25 mg total) by mouth 2 (two) times daily.     omeprazole 20 MG capsule  Commonly known as:  PRILOSEC  Take 1 capsule (20 mg total) by mouth daily.       Follow-up Information    [facility]. Schedule an appointment as soon as possible for a visit in 1 month.   Specialty:  Cardiology   Contact information:   [address] [place] [address] [phone]        The results of significant diagnostics from this hospitalization (including imaging, microbiology, ancillary and laboratory) are listed below for reference.    Significant Diagnostic Studies: Ct Angio Chest Pe W/cm &/or Wo Cm  04/17/2014   CLINICAL DATA:  Acute onset of substernal chest pain. Pain worsens with deep breath. Current history of moderate asthma. Elevated D-dimer. Initial encounter.  EXAM: CT ANGIOGRAPHY CHEST WITH CONTRAST  TECHNIQUE: Multidetector CT imaging of the chest was performed using the standard protocol during bolus administration of intravenous contrast. Multiplanar CT image reconstructions and MIPs were obtained to evaluate  the vascular anatomy.  CONTRAST:  OMNIPAQUE IOHEXOL 350 MG/ML SOLN  COMPARISON:  Chest radiograph performed earlier today at 7:18 p.m.  FINDINGS: There is no evidence of pulmonary embolus.  There is a small 4 mm pleural based nodule within the right upper lobe (image 40 of 102). The lungs are otherwise clear. There is no evidence of significant focal consolidation, pleural effusion or pneumothorax. No masses are identified; no abnormal focal contrast enhancement is seen.  The mediastinum is unremarkable in appearance. Residual thymic tissue is somewhat prominent but likely within normal limits. No mediastinal lymphadenopathy is seen. No pericardial effusion is identified. No axillary lymphadenopathy is seen. The thyroid gland is mildly enlarged and heterogeneous, raising question for thyroid goiter.  The visualized portions of the liver and spleen are unremarkable. The visualized portions of the pancreas, stomach, adrenal glands and kidneys are within normal limits.  No acute osseous abnormalities are seen.  Review of the MIP images confirms the  above findings.  IMPRESSION: 1. No evidence of pulmonary embolus. 2. Small 4 mm pleural-based nodule at the right upper lobe. Lungs otherwise clear. If the patient is at high risk for bronchogenic carcinoma, follow-up chest CT at 1 year is recommended. If the patient is at low risk, no follow-up is needed. This recommendation follows the consensus statement: Guidelines for Management of Small Pulmonary Nodules Detected on CT Scans: A Statement from the Fleischner Society as published in Radiology 2005; 237:395-400. 3. Mildly enlarged and heterogeneous appearance to thyroid gland, raising question for thyroid goiter. Would correlate with lab values.   Electronically Signed   By: Roanna Raider M.D.   On: 04/17/2014 02:28   Nm Myocar Multi W/spect W/wall Motion / Ef  04/18/2014   CLINICAL DATA:  Chest pain. History of myocardial infarction/ coronary artery disease. Initial encounter.  EXAM: MYOCARDIAL IMAGING WITH SPECT (REST AND EXERCISE)  GATED LEFT VENTRICULAR WALL MOTION STUDY  LEFT VENTRICULAR EJECTION FRACTION  TECHNIQUE: Standard myocardial SPECT imaging was performed after resting intravenous injection of 10 mCi Tc-38m sestamibi. Subsequently, exercise tolerance test was performed by the patient under the supervision of the Cardiology staff. At peak-stress, 30 mCi Tc-51m sestamibi was injected intravenously and standard myocardial SPECT imaging was performed. Quantitative gated imaging was also performed to evaluate left ventricular wall motion, and estimate left ventricular ejection fraction.  COMPARISON:  Chest CTA 04/17/2014.  FINDINGS: Perfusion: No decreased activity in the left ventricle on stress imaging to suggest reversible ischemia or infarction.  Wall Motion: Normal left ventricular wall motion. No left ventricular dilation.  Left Ventricular Ejection Fraction: 58 %  End diastolic volume 134 ml  End systolic volume 56 ml  IMPRESSION: 1. No reversible ischemia or infarction.  2. Normal left  ventricular wall motion.  3. Left ventricular ejection fraction 58%  4. Low-risk stress test findings*.  *2012 Appropriate Use Criteria for Coronary Revascularization Focused Update: J Am Coll Cardiol. 2012;59(9):857-881. http://content.dementiazones.com.aspx?articleid=1201161   Electronically Signed   By: Roxy Horseman M.D.   On: 04/18/2014 14:42   Dg Chest Port 1 View  04/16/2014   CLINICAL DATA:  Chest pain which started this afternoon. History of prior heart attack. Shortness of breath and dizzy.  EXAM: PORTABLE CHEST - 1 VIEW  COMPARISON:  None.  FINDINGS: Normal cardiac silhouette and mediastinal contours. No focal parenchymal opacities. No pleural effusion or pneumothorax. No evidence of edema. No acute osseus abnormalities.  IMPRESSION: No acute cardiopulmonary disease.   Electronically Signed   By: Holland Commons.D.  On: 04/16/2014 19:48    Microbiology: No results found for this or any previous visit (from the past 240 hour(s)).   Labs: Basic Metabolic Panel:  Recent Labs Lab 04/16/14 1818  NA 139  K 3.5*  CL 101  CO2 23  GLUCOSE 95  BUN 11  CREATININE 0.79  CALCIUM 9.5   Liver Function Tests: No results for input(s): AST, ALT, ALKPHOS, BILITOT, PROT, ALBUMIN in the last 168 hours. No results for input(s): LIPASE, AMYLASE in the last 168 hours. No results for input(s): AMMONIA in the last 168 hours. CBC:  Recent Labs Lab 04/16/14 1818  WBC 4.1  HGB 15.3  HCT 43.8  MCV 82.0  PLT 200   Cardiac Enzymes:  Recent Labs Lab 04/16/14 2301 04/17/14 0510 04/17/14 1035  CKTOTAL 67  --   --   CKMB 1.3  --   --   TROPONINI <0.30 <0.30 <0.30   BNP: BNP (last 3 results) No results for input(s): PROBNP in the last 8760 hours. CBG: No results for input(s): GLUCAP in the last 168 hours.   Additional labs: 1. TSH <0.005 2. 2-D echo 04/17/14: Study Conclusions  - Left ventricle: The cavity size was normal. Systolic function was mildly reduced. The estimated  ejection fraction was in the range of 45% to 50%. Doppler parameters are consistent with abnormal left ventricular relaxation (grade 1 diastolic dysfunction). - Mitral valve: There was mild regurgitation.   Signed:  Marcellus ScottHONGALGI,ANAND, MD, FACP, FHM. [facility] Pager [phone]  If 7PM-7AM, please contact night-coverage [email or web address] Password [email or web address] 04/18/2014, 3:23 PM

## 2014-04-19 DIAGNOSIS — E0590 Thyrotoxicosis, unspecified without thyrotoxic crisis or storm: Secondary | ICD-10-CM

## 2014-04-19 LAB — TSH: TSH: 0.006 u[IU]/mL — ABNORMAL LOW (ref 0.350–4.500)

## 2014-04-19 MED ORDER — METHIMAZOLE 10 MG PO TABS: 10.0000 mg | ORAL_TABLET | Freq: Two times a day (BID) | ORAL | Status: AC

## 2014-04-19 NOTE — Care Management Note (Signed)
    Page 1 of 1   04/19/2014     5:22:27 PM CARE MANAGEMENT NOTE 04/19/2014  Patient:  Cameron Osborn,Cameron Osborn   Account Number:  [id number]  Date Initiated:  04/19/2014  Documentation initiated by:  Lanier ClamMAHABIR,Faren Florence  Subjective/Objective Assessment:   38 Y/O M ADMITTED W/CHEST PAIN.     Action/Plan:   FROM HOME.   Anticipated DC Date:  04/18/2014   Anticipated DC Plan:  HOME/SELF CARE      DC Planning Services  CM consult  [facility]      Choice offered to / List presented to:             Status of service:  Completed, signed off Medicare Important Message given?   (If response is "NO", the following Medicare IM given date fields will be blank) Date Medicare IM given:   Medicare IM given by:   Date Additional Medicare IM given:   Additional Medicare IM given by:    Discharge Disposition:  HOME/SELF CARE  Per UR Regulation:  Reviewed for med. necessity/level of care/duration of stay  If discussed at Long Length of Stay Meetings, dates discussed:    Comments:  04/19/14 Lionell Matuszak RN,BSN NCM [phone] RECEIVED CALL FROM ATTENDING TO CONTACT PATIENT FOR HOSPITAL PCP F/U APPT AFTER D/C.INITIALLY UNABLE TO REACH PATIENT ON TEL#[phone],BUT EVENTUALLY ABLE TO REACH HIM.INFORMED PATIENT TO CONTACT [facility] FOR HOSPITAL PCP F/U APPT ON TEL#[phone] IN THE MORNING.HE VOICED UNDERSTANDING.

## 2014-04-19 NOTE — Progress Notes (Signed)
Addendum   - TFTs were drawn prior to patient's discharge from the hospital on 04/18/14. TFTs are abnormal: TSH 0.006, free T4: 3.95 and free T3: 18.6. These results aren't in keeping with hyperthyroidism. Clinically patient did not appear overtly hyperthyroid. Discussed with endocrinologist Dr. Carlus Pavlovristina Gherghe on 04/19/14 and she recommended starting him on methimazole 10 MG twice a day. He is already on BB's. Prescription for same was sent to his [facility]. She indicated that he will need a thyroid uptake scan which cannot be done for 1 month secondary to recent CTA chest and IV contrast. TFTs will need to be repeated in one month. Patient lacks insurance but she is happy to see him in consult as outpatient and he will be a self pay. She also advised of no need for follow-up CBC or LFTs after starting Methimazole, unless patient develops symptoms i.e. fever, sore throat, right upper quadrant pain, jaundice, dark urine or pale stools.   - After several attempts, was able to reach patient and explained in detail regarding his new diagnosis of hyperthyroid and possible Graves' disease. He did give history of 4 episodes of mild palpitations since September 2014. He has lost significant weight (220 pounds >156 pounds) over the last 12 months, unintentionally. He denies excessive heat or cold intolerance. No reported diarrhea. He was provided the contact number for the endocrinologist to make a new patient appointment. He was advised to pick up his prescription for methimazole from the pharmacy and regarding side effects to watch out for in which case he is advised to stop taking Methimazole and seek immediate medical attention.  - [facility] also [facility] to arrange for follow[facility] where he can have PCP follow-up and may be able to obtain medications.  Marcellus ScottHONGALGI,Jafari Mckillop, MD, FACP, FHM. [facility] Pager [phone]  If 7PM-7AM, please contact  night-coverage [email or web address] Password [email or web address] 04/19/2014, 2:26 PM

## 2015-07-27 IMAGING — CT CT ANGIO CHEST
2 of 6 series · 18 of 36 positions shown · IV contrast (OMNIPAQUE)
Comparison: Chest radiograph performed earlier today at [DATE] p.m.

CLINICAL DATA: Acute onset of substernal chest pain. Pain worsens
with deep breath. Current history of moderate asthma. Elevated
D-dimer. Initial encounter.

EXAM:
CT ANGIOGRAPHY CHEST WITH CONTRAST
TECHNIQUE: Multidetector CT imaging of the chest was performed using the
standard protocol during bolus administration of intravenous
contrast. Multiplanar CT image reconstructions and MIPs were
obtained to evaluate the vascular anatomy.
CONTRAST:  100mL OMNIPAQUE IOHEXOL 350 MG/ML SOLN

[Series 6: pe thins @ 1mm · axial · 0.63mm/px · z∈[+1124,+1398]mm · 17 of 305 slices shown]
[im 16/305  lung]
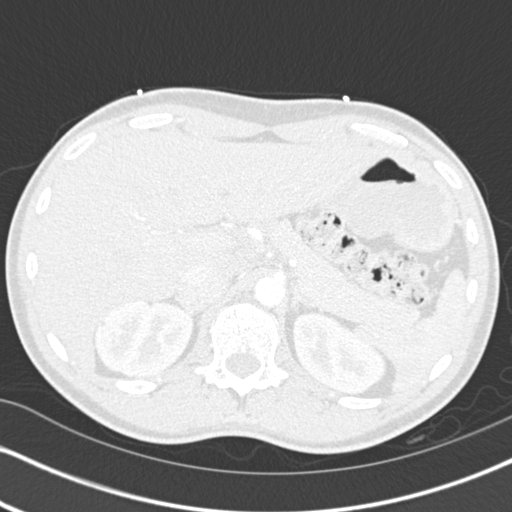
[im 31/305  mediastinal]
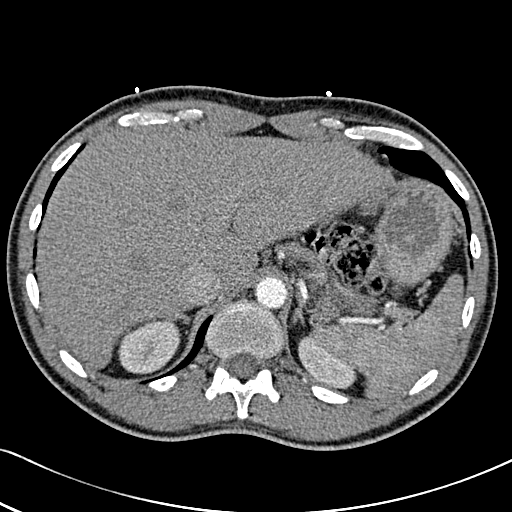
[im 46/305  lung]
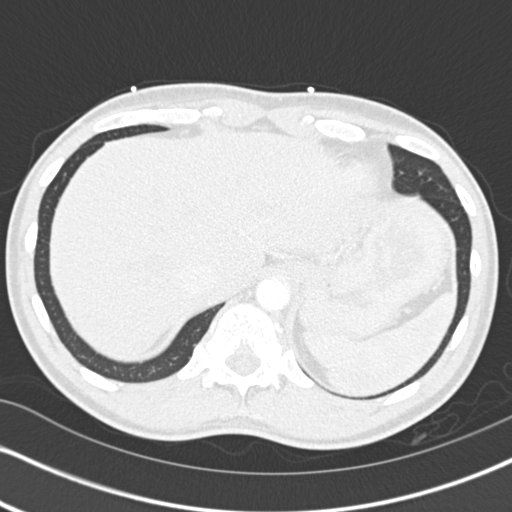
[im 61/305  mediastinal]
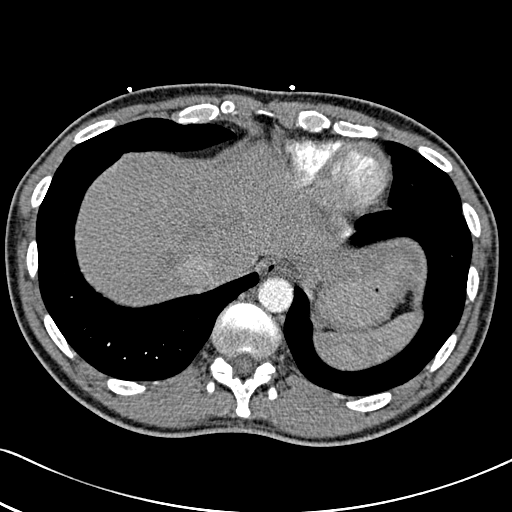
[im 92/305  lung]
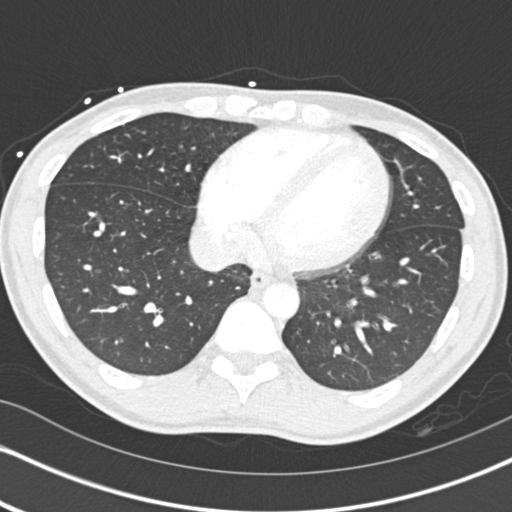
[im 107/305  mediastinal]
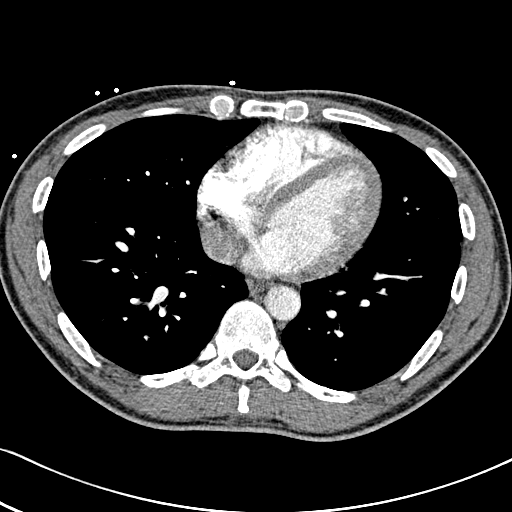
[im 122/305  lung]
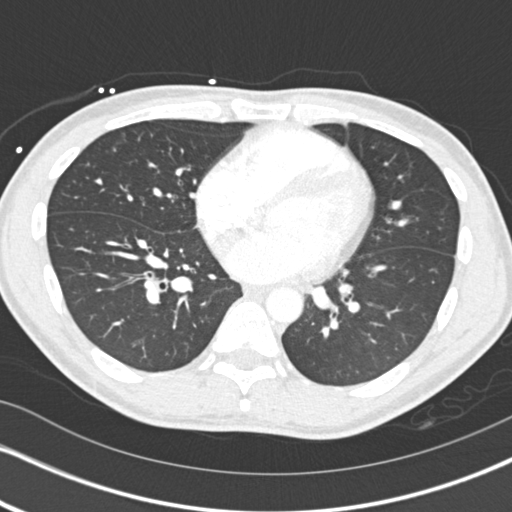
[im 137/305  mediastinal]
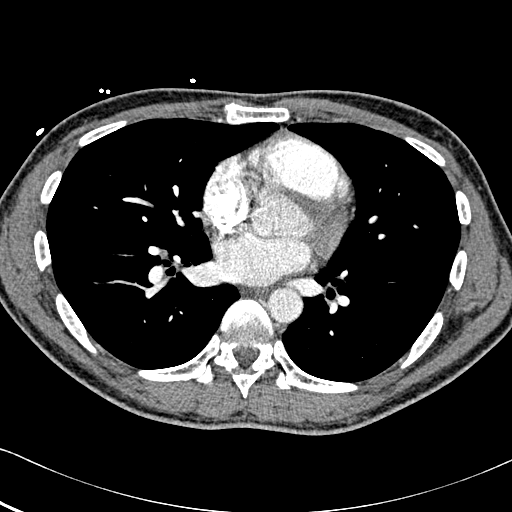
[im 153/305  lung]
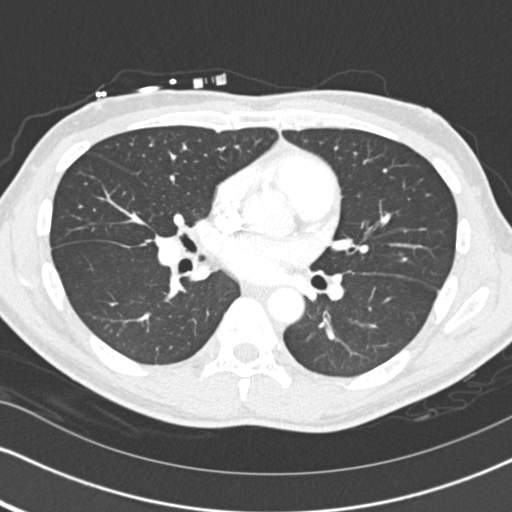
[im 168/305  mediastinal]
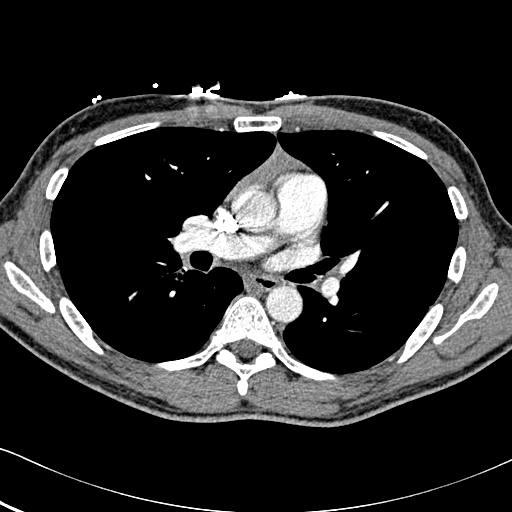
[im 183/305  lung]
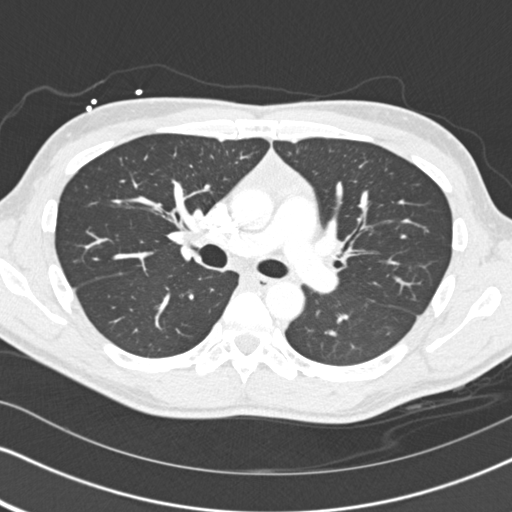
[im 198/305  mediastinal]
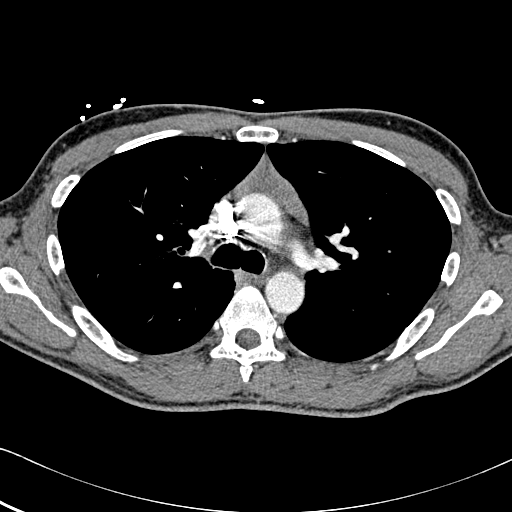
[im 213/305  lung]
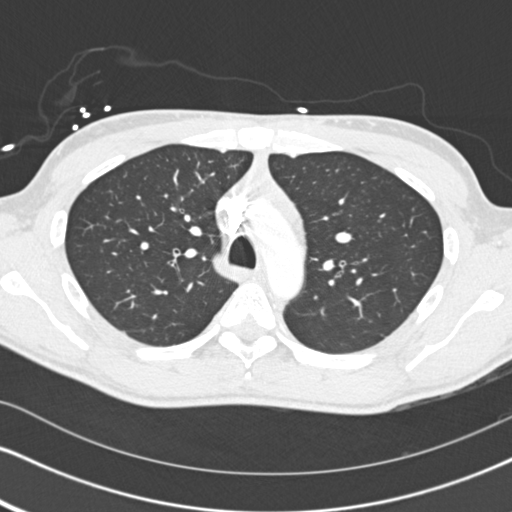
[im 244/305  mediastinal]
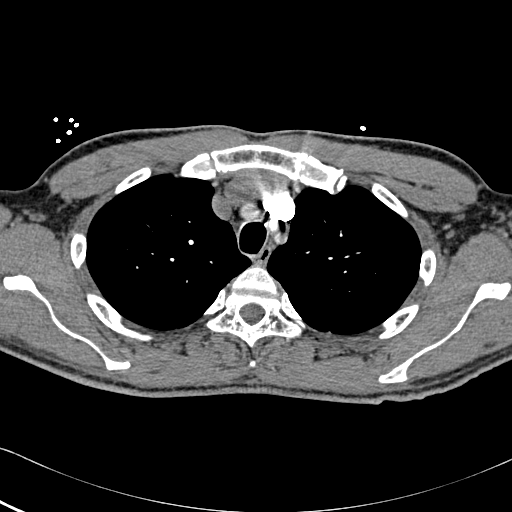
[im 259/305  lung]
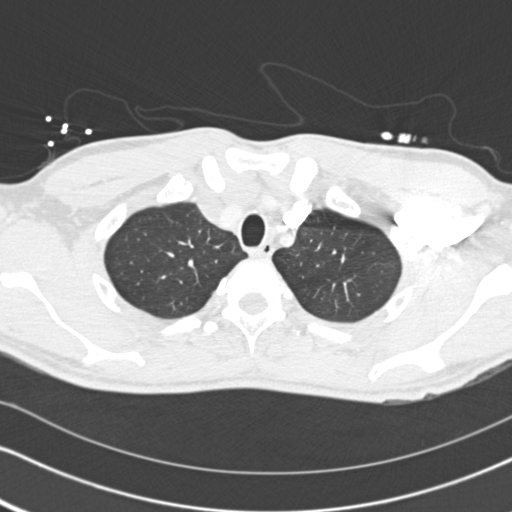
[im 274/305  mediastinal]
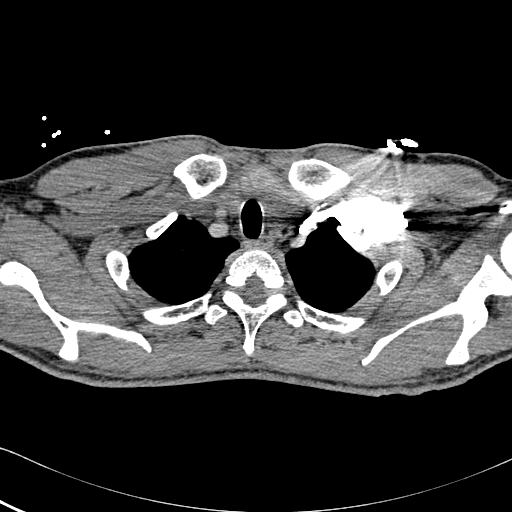
[im 289/305  lung]
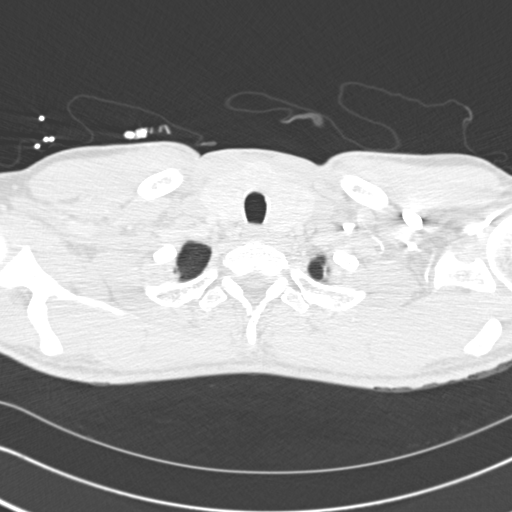

[Series 606: <mpr thick range(2)> · coronal · 0.63mm/px · 1 of 101 slices shown]
[im 51/101  mediastinal]
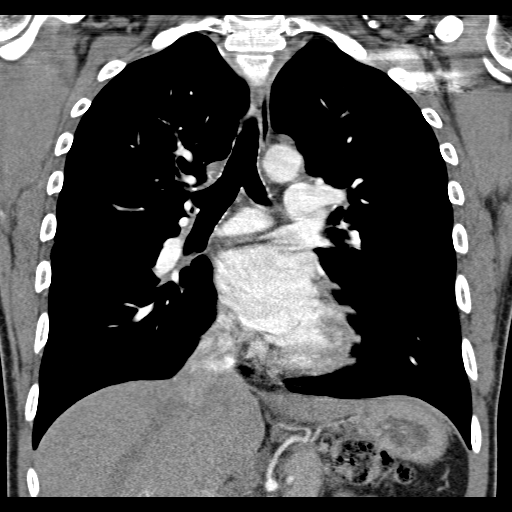

[18 of 36 positions shown; findings below may reference images not displayed]

FINDINGS: There is no evidence of pulmonary embolus.

There is a small 4 mm pleural based nodule within the right upper
lobe (image 40 of 102). The lungs are otherwise clear. There is no
evidence of significant focal consolidation, pleural effusion or
pneumothorax. No masses are identified; no abnormal focal contrast
enhancement is seen.

The mediastinum is unremarkable in appearance. Residual thymic
tissue is somewhat prominent but likely within normal limits. No
mediastinal lymphadenopathy is seen. No pericardial effusion is
identified. No axillary lymphadenopathy is seen. The thyroid gland
is mildly enlarged and heterogeneous, raising question for thyroid
goiter.

The visualized portions of the liver and spleen are unremarkable.
The visualized portions of the pancreas, stomach, adrenal glands and
kidneys are within normal limits.

No acute osseous abnormalities are seen.

Review of the MIP images confirms the above findings.
IMPRESSION: 1. No evidence of pulmonary embolus.
2. Small 4 mm pleural-based nodule at the right upper lobe. Lungs
otherwise clear. If the patient is at high risk for bronchogenic
carcinoma, follow-up chest CT at 1 year is recommended. If the
patient is at low risk, no follow-up is needed. This recommendation
follows the consensus statement: Guidelines for Management of Small
Pulmonary Nodules Detected on CT Scans: A Statement from the
3. Mildly enlarged and heterogeneous appearance to thyroid gland,
raising question for thyroid goiter. Would correlate with lab
values.

## 2016-01-31 ENCOUNTER — Emergency Department (HOSPITAL_COMMUNITY)
Admission: EM | Admit: 2016-01-31 | Discharge: 2016-01-31 | Disposition: A | Discharge status: Home / Self Care | Payer: Self-pay | Attending: Emergency Medicine | Admitting: Emergency Medicine

## 2016-01-31 ENCOUNTER — Encounter (HOSPITAL_COMMUNITY): Payer: Self-pay | Admitting: Emergency Medicine

## 2016-01-31 ENCOUNTER — Emergency Department (HOSPITAL_COMMUNITY): Payer: Self-pay

## 2016-01-31 DIAGNOSIS — J45909 Unspecified asthma, uncomplicated: Secondary | ICD-10-CM | POA: Insufficient documentation

## 2016-01-31 DIAGNOSIS — Y929 Unspecified place or not applicable: Secondary | ICD-10-CM | POA: Insufficient documentation

## 2016-01-31 DIAGNOSIS — Y939 Activity, unspecified: Secondary | ICD-10-CM | POA: Insufficient documentation

## 2016-01-31 DIAGNOSIS — W1800XA Striking against unspecified object with subsequent fall, initial encounter: Secondary | ICD-10-CM | POA: Insufficient documentation

## 2016-01-31 DIAGNOSIS — Y999 Unspecified external cause status: Secondary | ICD-10-CM | POA: Insufficient documentation

## 2016-01-31 DIAGNOSIS — F1910 Other psychoactive substance abuse, uncomplicated: Secondary | ICD-10-CM | POA: Insufficient documentation

## 2016-01-31 DIAGNOSIS — Z5181 Encounter for therapeutic drug level monitoring: Secondary | ICD-10-CM | POA: Insufficient documentation

## 2016-01-31 DIAGNOSIS — R55 Syncope and collapse: Secondary | ICD-10-CM

## 2016-01-31 DIAGNOSIS — I2510 Atherosclerotic heart disease of native coronary artery without angina pectoris: Secondary | ICD-10-CM | POA: Insufficient documentation

## 2016-01-31 LAB — URINALYSIS, ROUTINE W REFLEX MICROSCOPIC
BILIRUBIN URINE: NEGATIVE
Glucose, UA: NEGATIVE mg/dL
HGB URINE DIPSTICK: NEGATIVE
Ketones, ur: NEGATIVE mg/dL
Leukocytes, UA: NEGATIVE
NITRITE: NEGATIVE
PH: 7.5 (ref 5.0–8.0)
Protein, ur: NEGATIVE mg/dL
SPECIFIC GRAVITY, URINE: 1.024 (ref 1.005–1.030)

## 2016-01-31 LAB — RAPID URINE DRUG SCREEN, HOSP PERFORMED
Amphetamines: NOT DETECTED
BARBITURATES: NOT DETECTED
Benzodiazepines: POSITIVE — AB
Cocaine: POSITIVE — AB
Opiates: NOT DETECTED
TETRAHYDROCANNABINOL: POSITIVE — AB

## 2016-01-31 LAB — BASIC METABOLIC PANEL
Anion gap: 8 (ref 5–15)
BUN: 10 mg/dL (ref 6–20)
CHLORIDE: 107 mmol/L (ref 101–111)
CO2: 25 mmol/L (ref 22–32)
Calcium: 9.7 mg/dL (ref 8.9–10.3)
Creatinine, Ser: 0.7 mg/dL (ref 0.61–1.24)
GFR calc Af Amer: >60 mL/min (ref >60)
GFR calc non Af Amer: >60 mL/min (ref >60)
Glucose, Bld: 102 mg/dL — ABNORMAL HIGH (ref 65–99)
POTASSIUM: 4.3 mmol/L (ref 3.5–5.1)
Sodium: 140 mmol/L (ref 135–145)

## 2016-01-31 LAB — CBC
HEMATOCRIT: 48 % (ref 39.0–52.0)
Hemoglobin: 16.3 g/dL (ref 13.0–17.0)
MCH: 29.3 pg (ref 26.0–34.0)
MCHC: 34 g/dL (ref 30.0–36.0)
MCV: 86.2 fL (ref 78.0–100.0)
Platelets: 212 10*3/uL (ref 150–400)
RBC: 5.57 MIL/uL (ref 4.22–5.81)
RDW: 12.8 % (ref 11.5–15.5)
WBC: 3.1 10*3/uL — AB (ref 4.0–10.5)

## 2016-01-31 LAB — I-STAT TROPONIN, ED: Troponin i, poc: 0.02 ng/mL (ref 0.00–0.08)

## 2016-01-31 LAB — CBG MONITORING, ED: Glucose-Capillary: 102 mg/dL — ABNORMAL HIGH (ref 65–99)

## 2016-01-31 MED ORDER — SODIUM CHLORIDE 0.9 % IV SOLN
INTRAVENOUS | Status: DC
  Administered 2016-01-31: 125 mL/h via INTRAVENOUS

## 2016-01-31 MED ORDER — SODIUM CHLORIDE 0.9 % IV BOLUS (SEPSIS)
1000.0000 mL | Freq: Once | INTRAVENOUS | Status: AC
  Administered 2016-01-31: 1000 mL via INTRAVENOUS

## 2016-01-31 NOTE — ED Notes (Signed)
Bed: [id number] Expected date:  Expected time:  Means of arrival:  Comments: EMS, lethargic/weakness

## 2016-01-31 NOTE — ED Notes (Signed)
Pt ambulated out of department with family. No reaction to medication noted at discharge.

## 2016-01-31 NOTE — ED Notes (Signed)
Pt has been told that a urine sample is needed from him, urinal at the bedside.

## 2016-01-31 NOTE — ED Triage Notes (Signed)
Pt reports he has been having generalized weakness for the past few days. Today he had a syncopal episode after lunch. Did not have breakfast this am. Pt hit head during the fall. Pt woke up afterwards, took 2 OTC pain medications, then fell asleep. Woke up to EMS. Family said pt had called them earlier and said he loved them. Family was concerned for possible OD. Pt said he was just checking on mother and ended call by he loved her. Denies SI/HI. No substance abuse. No CP. Has pain on L side of head where he hit it. No lacerations.

## 2016-01-31 NOTE — Discharge Instructions (Signed)
Follow up with your primary care provider for discussion of today's diagnosis.  Please only take medications that are prescribed by your doctor.  Return to ER for chest pain, difficulty breathing, new or worsening symptoms, any additional concerns.

## 2016-01-31 NOTE — ED Notes (Signed)
Pt. Ambulated to bathroom and hallway without difficulty .

## 2016-01-31 NOTE — ED Provider Notes (Signed)
[facility]-EMERGENCY DEPT Provider Note   CSN: [id number] Arrival date & time: 01/31/16  1659     History   Chief Complaint Chief Complaint  Patient presents with  . Loss of Consciousness  . Fall    HPI Cameron Osborn is a 40 y.o. male.  The history is provided by the patient and medical records. No language interpreter was used.  Loss of Consciousness   Associated symptoms include headaches and weakness. Pertinent negatives include abdominal pain, back pain, chest pain, congestion, dizziness, fever, nausea and vomiting.  Fall  Associated symptoms include headaches. Pertinent negatives include no chest pain, no abdominal pain and no shortness of breath.   Cameron Osborn is a 40 y.o. male  with a PMH of CAD, prior MI and asthma who presents to the Emergency Department for evaluation after syncopal event around 1pm today. Patient states he was stood up this afternoon and his whole body felt like it was tingling, associated with generalized weakness. He states he must have passed out. Family at bedside did not see fall, but found him down, stating it looked like he hit his head on the chair. Upon evaluation, patient is complaining of right-sided headache where he believes he hit his head. His body is no longer tingling, but he continues to feel weak. Took OTC pain reliever for headache prior to arrival with some relief. No other medications taken prior to arrival for symptoms. Endorses smoking weed yesterday, but no drug or ETOH use today.   Past Medical History:  Diagnosis Date  . Asthma   . Coronary artery disease   . MI (myocardial infarction) [facility](HCC)     Patient Active Problem List   Diagnosis Date Noted  . Sinus tachycardia (HCC) 04/17/2014  . Abnormal EKG 04/17/2014  . Chest pain 04/16/2014    Past Surgical History:  Procedure Laterality Date  . CARDIAC CATHETERIZATION    . ESOPHAGOGASTRODUODENOSCOPY N/A 06/09/2013   Procedure: ESOPHAGOGASTRODUODENOSCOPY (EGD);   Surgeon: Hart Carwinora M Brodie, MD;  [facility];  Service: Endoscopy;  Laterality: N/A;       Home Medications    Prior to Admission medications   Medication Sig Start Date End Date Taking? Authorizing Provider  aspirin EC 81 MG EC tablet Take 1 tablet (81 mg total) by mouth daily. Patient not taking: Reported on 01/31/2016 04/18/14   Elease EtienneAnand D Hongalgi, MD  methimazole (TAPAZOLE) 10 MG tablet Take 1 tablet (10 mg total) by mouth 2 (two) times daily. Patient not taking: Reported on 01/31/2016 04/19/14   Elease EtienneAnand D Hongalgi, MD  metoprolol tartrate (LOPRESSOR) 25 MG tablet Take 1 tablet (25 mg total) by mouth 2 (two) times daily. Patient not taking: Reported on 01/31/2016 04/18/14   Elease EtienneAnand D Hongalgi, MD  omeprazole (PRILOSEC) 20 MG capsule Take 1 capsule (20 mg total) by mouth daily. Patient not taking: Reported on 01/31/2016 04/18/14   Elease EtienneAnand D Hongalgi, MD    Family History Family History  Problem Relation Age of Onset  . Stroke Father   . Hypertension Father   . Hypertension Brother     Social History Social History  Substance Use Topics  . Smoking status: Never Smoker  . Smokeless tobacco: Never Used  . Alcohol use Yes     Comment: socially     Allergies   Review of patient's allergies indicates no known allergies.   Review of Systems Review of Systems  Constitutional: Negative for chills and fever.  HENT: Negative for congestion.   Eyes: Negative  for visual disturbance.  Respiratory: Negative for cough and shortness of breath.   Cardiovascular: Positive for syncope. Negative for chest pain.  Gastrointestinal: Negative for abdominal pain, nausea and vomiting.  Genitourinary: Negative for dysuria.  Musculoskeletal: Negative for back pain and neck pain.  Skin: Negative for rash.  Neurological: Positive for syncope, weakness and headaches. Negative for dizziness.     Physical Exam Updated Vital Signs BP 133/86 (BP Location: Right Arm)   Pulse 77   Temp 98.4 F (36.9  C) (Oral)   Resp 19   SpO2 99%   Physical Exam  Constitutional: He is oriented to person, place, and time. He appears well-developed and well-nourished. No distress.  HENT:  Head: Normocephalic and atraumatic.  OP clear, tacky mucus membranes.   Cardiovascular: Normal rate, regular rhythm, normal heart sounds and intact distal pulses.   Pulmonary/Chest: Effort normal and breath sounds normal. No respiratory distress. He has no wheezes. He has no rales.  Abdominal: Soft. Bowel sounds are normal. He exhibits no distension. There is no tenderness.  Musculoskeletal: He exhibits no edema.  Neurological: He is alert and oriented to person, place, and time.  Alert, oriented, thought content appropriate, able to give a coherent history. Speech is clear and goal oriented, able to follow commands.  Cranial Nerves:  II:  Peripheral visual fields grossly normal, pupils equal, round, reactive to light III, IV, VI: EOM intact bilaterally, ptosis not present V,VII: smile symmetric, eyes kept closed tightly against resistance, facial light touch sensation equal VIII: hearing grossly normal IX, X: symmetric soft palate movement, uvula elevates symmetrically  XI: bilateral shoulder shrug symmetric and strong XII: midline tongue extension 5/5 muscle strength in upper and lower extremities bilaterally including strong and equal grip strength and dorsiflexion/plantar flexion Sensory to light touch normal in all four extremities.  Normal finger-to-nose and rapid alternating movements.  Skin: Skin is warm and dry.  Nursing note and vitals reviewed.    ED Treatments / Results  Labs (all labs ordered are listed, but only abnormal results are displayed) Labs Reviewed  BASIC METABOLIC PANEL - Abnormal; Notable for the following:       Result Value   Glucose, Bld 102 (*)    All other components within normal limits  CBC - Abnormal; Notable for the following:    WBC 3.1 (*)    All other components  within normal limits  URINE RAPID DRUG SCREEN, HOSP PERFORMED - Abnormal; Notable for the following:    Cocaine POSITIVE (*)    Benzodiazepines POSITIVE (*)    Tetrahydrocannabinol POSITIVE (*)    All other components within normal limits  CBG MONITORING, ED - Abnormal; Notable for the following:    Glucose-Capillary 102 (*)    All other components within normal limits  URINALYSIS, ROUTINE W REFLEX MICROSCOPIC (NOT AT [facility])  I-STAT TROPOININ, ED  POCT CBG (FASTING - GLUCOSE)-MANUAL ENTRY    EKG  EKG Interpretation  Date/Time:  Friday January 31 2016 17:19:11 EDT Ventricular Rate:  83 PR Interval:    QRS Duration: 79 QT Interval:  401 QTC Calculation: 472 R Axis:   81 Text Interpretation:  Sinus rhythm Probable left atrial enlargement Left ventricular hypertrophy Abnrm T, consider ischemia, anterolateral lds No significant change in morphlogy v. 11/15, and 12/15 Confirmed by Fayrene Fearing  MD, MARK ([id number]) on 01/31/2016 5:25:49 PM Also confirmed by [facility] MD, ERIN ([id number])  on 01/31/2016 8:49:37 PM       Radiology Ct Head Wo Contrast  Result Date:  01/31/2016 CLINICAL DATA:  Patient with generalized weakness. Patient with syncopal episode status post fall. EXAM: CT HEAD WITHOUT CONTRAST TECHNIQUE: Contiguous axial images were obtained from the base of the skull through the vertex without intravenous contrast. COMPARISON:  None. FINDINGS: Brain: Ventricles and sulci are appropriate for patient's age. No evidence for acute cortically based infarct, intracranial hemorrhage, mass lesion or mass-effect. Vascular: Unremarkable Skull: Intact. Sinuses/Orbits: Paranasal sinuses and mastoid air cells unremarkable. Orbits are unremarkable. Other: None. IMPRESSION: No acute intracranial process. Electronically Signed   By: Annia Belt M.D.   On: 01/31/2016 18:28    Procedures Procedures (including critical care time)  Medications Ordered in ED Medications  sodium chloride 0.9 % bolus 1,000 mL (0  mLs Intravenous Stopped 01/31/16 1933)    And  0.9 %  sodium chloride infusion (125 mL/hr Intravenous New Bag/Given 01/31/16 1933)     Initial Impression / Assessment and Plan / ED Course  I have reviewed the triage vital signs and the nursing notes.  Pertinent labs & imaging results that were available during my care of the patient were reviewed by me and considered in my medical decision making (see chart for details).  Clinical Course   Cameron Osborn is a 40 y.o. male with PMH of CAD and prior MI followed by Dr. Chales Abrahams from [facility] who presents to ED for evaluation after syncopal episode a few hours prior to arrival. Patient's whole body felt "tingly" then he had syncopal episode, hitting head on a chair. On exam, patient is alert and oriented with no focal neuro deficits. EKG reviewed. Still feels "weak" but no other associated symptoms at this time. Blood work reviewed and reassuring.    7:50 PM - Patient re-evaluated and feels much improved. Patient states he feels back to usual state of health and family at bedside state he is back to baseline as well. Awaiting urine results.   Patient re-evaluated. He is ambulatory in ED with no difficulty. Tolerating PO. Very well appearing. UDS + for cocaine, benzos and THC. I suspect this was a contributing factor in today's events. Cardiac etiology unlikely but patient was encouraged to still follow up with cards. Reasons to return to ED discussed and all questions answered.   Patient seen by and discussed with Dr. Dalene Seltzer who agrees with treatment plan.   Final Clinical Impressions(s) / ED Diagnoses   Final diagnoses:  Syncope and collapse  Polysubstance abuse    New Prescriptions New Prescriptions   No medications on file     [facility] Nicosha Struve, PA-C 01/31/16 2107    Alvira Monday, MD 02/02/16 2137

## 2016-02-23 ENCOUNTER — Emergency Department (HOSPITAL_COMMUNITY)
Admission: EM | Admit: 2016-02-23 | Discharge: 2016-02-23 | Disposition: A | Discharge status: Home / Self Care | Payer: Self-pay | Attending: Emergency Medicine | Admitting: Emergency Medicine

## 2016-02-23 ENCOUNTER — Emergency Department (HOSPITAL_COMMUNITY): Payer: Self-pay

## 2016-02-23 ENCOUNTER — Encounter (HOSPITAL_COMMUNITY): Payer: Self-pay

## 2016-02-23 DIAGNOSIS — Z7982 Long term (current) use of aspirin: Secondary | ICD-10-CM | POA: Insufficient documentation

## 2016-02-23 DIAGNOSIS — I2510 Atherosclerotic heart disease of native coronary artery without angina pectoris: Secondary | ICD-10-CM | POA: Insufficient documentation

## 2016-02-23 DIAGNOSIS — Z79899 Other long term (current) drug therapy: Secondary | ICD-10-CM | POA: Insufficient documentation

## 2016-02-23 DIAGNOSIS — J45909 Unspecified asthma, uncomplicated: Secondary | ICD-10-CM | POA: Insufficient documentation

## 2016-02-23 DIAGNOSIS — R0789 Other chest pain: Secondary | ICD-10-CM | POA: Insufficient documentation

## 2016-02-23 DIAGNOSIS — I252 Old myocardial infarction: Secondary | ICD-10-CM | POA: Insufficient documentation

## 2016-02-23 LAB — BASIC METABOLIC PANEL
ANION GAP: 11 (ref 5–15)
BUN: 6 mg/dL (ref 6–20)
CALCIUM: 9.7 mg/dL (ref 8.9–10.3)
CHLORIDE: 107 mmol/L (ref 101–111)
CO2: 23 mmol/L (ref 22–32)
CREATININE: 0.6 mg/dL — AB (ref 0.61–1.24)
GFR calc non Af Amer: >60 mL/min (ref >60)
Glucose, Bld: 121 mg/dL — ABNORMAL HIGH (ref 65–99)
Potassium: 3.9 mmol/L (ref 3.5–5.1)
SODIUM: 141 mmol/L (ref 135–145)

## 2016-02-23 LAB — CBC
HCT: 47.7 % (ref 39.0–52.0)
HEMOGLOBIN: 16.1 g/dL (ref 13.0–17.0)
MCH: 29.7 pg (ref 26.0–34.0)
MCHC: 33.8 g/dL (ref 30.0–36.0)
MCV: 87.8 fL (ref 78.0–100.0)
PLATELETS: 219 10*3/uL (ref 150–400)
RBC: 5.43 MIL/uL (ref 4.22–5.81)
RDW: 13.2 % (ref 11.5–15.5)
WBC: 5.3 10*3/uL (ref 4.0–10.5)

## 2016-02-23 LAB — RAPID URINE DRUG SCREEN, HOSP PERFORMED
Amphetamines: NOT DETECTED
Barbiturates: NOT DETECTED
Benzodiazepines: NOT DETECTED
COCAINE: POSITIVE — AB
OPIATES: NOT DETECTED
Tetrahydrocannabinol: POSITIVE — AB

## 2016-02-23 LAB — I-STAT TROPONIN, ED
TROPONIN I, POC: 0 ng/mL (ref 0.00–0.08)
Troponin i, poc: 0 ng/mL (ref 0.00–0.08)

## 2016-02-23 MED ORDER — ASPIRIN 81 MG PO CHEW
324.0000 mg | CHEWABLE_TABLET | Freq: Once | ORAL | Status: AC
  Administered 2016-02-23: 324 mg via ORAL
  Filled 2016-02-23: qty 4

## 2016-02-23 MED ORDER — ACETAMINOPHEN 500 MG PO TABS
1000.0000 mg | ORAL_TABLET | Freq: Once | ORAL | Status: AC
  Administered 2016-02-23: 1000 mg via ORAL
  Filled 2016-02-23: qty 2

## 2016-02-23 MED ORDER — GI COCKTAIL ~~LOC~~
30.0000 mL | Freq: Once | ORAL | Status: AC
  Administered 2016-02-23: 30 mL via ORAL
  Filled 2016-02-23: qty 30

## 2016-02-23 NOTE — ED Provider Notes (Signed)
[facility]-EMERGENCY DEPT Provider Note   CSN: [id number] Arrival date & time: 02/23/16  0903     History   Chief Complaint Chief Complaint  Patient presents with  . Chest Pain    HPI Cameron Osborn is a 40 y.o. male.  The history is provided by the patient.  Chest Pain   This is a new problem. The problem occurs constantly. Progression since onset: waxing and waning. The pain is associated with exertion and rest. The pain is present in the substernal region. The pain is moderate. The quality of the pain is described as pressure-like and sharp (squeezing). The pain does not radiate. Duration of episode(s) is 1 hour. Associated symptoms include diaphoresis, nausea and vomiting (1x). Pertinent negatives include no abdominal pain and no fever. Syncope: x1 after pain onset. He has tried nothing for the symptoms. Risk factors include male gender.  Pertinent negatives for past medical history include no diabetes, no hyperlipidemia, no hypertension and no stimulant use.  Procedure history is positive for cardiac catheterization (unremarkable) and stress echo (2 years ago).    Past Medical History:  Diagnosis Date  . Asthma   . Coronary artery disease   . MI (myocardial infarction)     Patient Active Problem List   Diagnosis Date Noted  . Sinus tachycardia 04/17/2014  . Abnormal EKG 04/17/2014  . Chest pain 04/16/2014    Past Surgical History:  Procedure Laterality Date  . CARDIAC CATHETERIZATION    . ESOPHAGOGASTRODUODENOSCOPY N/A 06/09/2013   Procedure: ESOPHAGOGASTRODUODENOSCOPY (EGD);  Surgeon: Hart Carwinora M Brodie, MD;  [facility];  Service: Endoscopy;  Laterality: N/A;       Home Medications    Prior to Admission medications   Medication Sig Start Date End Date Taking? Authorizing Provider  aspirin EC 81 MG EC tablet Take 1 tablet (81 mg total) by mouth daily. Patient not taking: Reported on 02/23/2016 04/18/14   Elease EtienneAnand D Hongalgi, MD  methimazole (TAPAZOLE) 10 MG  tablet Take 1 tablet (10 mg total) by mouth 2 (two) times daily. Patient not taking: Reported on 02/23/2016 04/19/14   Elease EtienneAnand D Hongalgi, MD  metoprolol tartrate (LOPRESSOR) 25 MG tablet Take 1 tablet (25 mg total) by mouth 2 (two) times daily. Patient not taking: Reported on 02/23/2016 04/18/14   Elease EtienneAnand D Hongalgi, MD  omeprazole (PRILOSEC) 20 MG capsule Take 1 capsule (20 mg total) by mouth daily. Patient not taking: Reported on 02/23/2016 04/18/14   Elease EtienneAnand D Hongalgi, MD    Family History Family History  Problem Relation Age of Onset  . Stroke Father   . Hypertension Father   . Hypertension Brother     Social History Social History  Substance Use Topics  . Smoking status: Never Smoker  . Smokeless tobacco: Never Used  . Alcohol use Yes     Comment: socially     Allergies   Review of patient's allergies indicates no known allergies.   Review of Systems Review of Systems  Constitutional: Positive for diaphoresis. Negative for fever.  Cardiovascular: Positive for chest pain. Syncope: x1 after pain onset.  Gastrointestinal: Positive for nausea and vomiting (1x). Negative for abdominal pain.  All other systems reviewed and are negative.    Physical Exam Updated Vital Signs BP 147/93   Pulse 107   Temp 99.3 F (37.4 C) (Oral)   Resp 20   Ht 5\' 9"  (1.753 m)   Wt 175 lb (79.4 kg)   SpO2 97%   BMI 25.84 kg/m  Physical Exam  Constitutional: He is oriented to person, place, and time. He appears well-developed and well-nourished. No distress.  HENT:  Head: Normocephalic and atraumatic.  Nose: Nose normal.  Eyes: Conjunctivae are normal.  Neck: Neck supple. No tracheal deviation present.  Cardiovascular: Normal rate, regular rhythm and normal heart sounds.  Exam reveals no gallop and no friction rub.   No murmur heard. Pulmonary/Chest: Effort normal and breath sounds normal. No respiratory distress. He has no wheezes. He exhibits no tenderness.  Abdominal: Soft. He  exhibits no distension. There is no tenderness.  Neurological: He is alert and oriented to person, place, and time.  Skin: Skin is warm and dry. Capillary refill takes less than 2 seconds.  Psychiatric: He has a normal mood and affect.     ED Treatments / Results  Labs (all labs ordered are listed, but only abnormal results are displayed) Labs Reviewed  BASIC METABOLIC PANEL - Abnormal; Notable for the following:       Result Value   Glucose, Bld 121 (*)    Creatinine, Ser 0.60 (*)    All other components within normal limits  URINE RAPID DRUG SCREEN, HOSP PERFORMED - Abnormal; Notable for the following:    Cocaine POSITIVE (*)    Tetrahydrocannabinol POSITIVE (*)    All other components within normal limits  CBC  I-STAT TROPOININ, ED  I-STAT TROPOININ, ED    EKG  EKG Interpretation  Date/Time:  Sunday February 23 2016 09:06:51 EDT Ventricular Rate:  111 PR Interval:  124 QRS Duration: 90 QT Interval:  328 QTC Calculation: 446 R Axis:   90 Text Interpretation:  Sinus tachycardia Right atrial enlargement Rightward axis Pulmonary disease pattern Left ventricular hypertrophy Abnormal ECG No significant change since last tracing Confirmed by Jesstin Studstill MD, Hazle Ogburn ([id number]) on 02/23/2016 9:24:06 AM       Radiology Dg Chest 2 View  Result Date: 02/23/2016 CLINICAL DATA:  Chest pain EXAM: CHEST  2 VIEW COMPARISON:  April 16, 2014 FINDINGS: The heart size and mediastinal contours are within normal limits. Both lungs are clear. The visualized skeletal structures are unremarkable. IMPRESSION: No active cardiopulmonary disease. Electronically Signed   By: Gerome Sam III M.D   On: 02/23/2016 09:55    Procedures Procedures (including critical care time)  Medications Ordered in ED Medications - No data to display   Initial Impression / Assessment and Plan / ED Course  I have reviewed the triage vital signs and the nursing notes.  Pertinent labs & imaging results that were  available during my care of the patient were reviewed by me and considered in my medical decision making (see chart for details).  Clinical Course    40 y.o. male presents with chest pain starting today. Has ho prior MI with normal coronaries and negative stress echo 2 years ago. Has apparent history of cocaine abuse which he initially denies. Serial troponins negative, EKG unchanged from prior, do not suspect ACS and Pt improved with GI cocktail. UDS positive for cocaine and Pt admits to using cocaine recently and twice before but I am highly suspicious for ongoing abuse and I discussed the need for cessation to prevent recurrence of symptoms or other complications. Has f/u with cardiology upcoming. Plan to follow up with PCP as needed and return precautions discussed for worsening or new concerning symptoms.   Final Clinical Impressions(s) / ED Diagnoses   Final diagnoses:  Atypical chest pain    New Prescriptions Discharge Medication List as of 02/23/2016  1:53 PM       Lyndal Pulley, MD 02/24/16 1836

## 2016-02-23 NOTE — ED Notes (Signed)
Pt is in stable condition upon d/c and ambulates from ED. 

## 2017-06-03 IMAGING — CR DG CHEST 2V
2 series · 2 of 2 positions shown · non-contrast
Comparison: April 16, 2014

CLINICAL DATA: Chest pain

EXAM:
CHEST  2 VIEW

[chest pa]
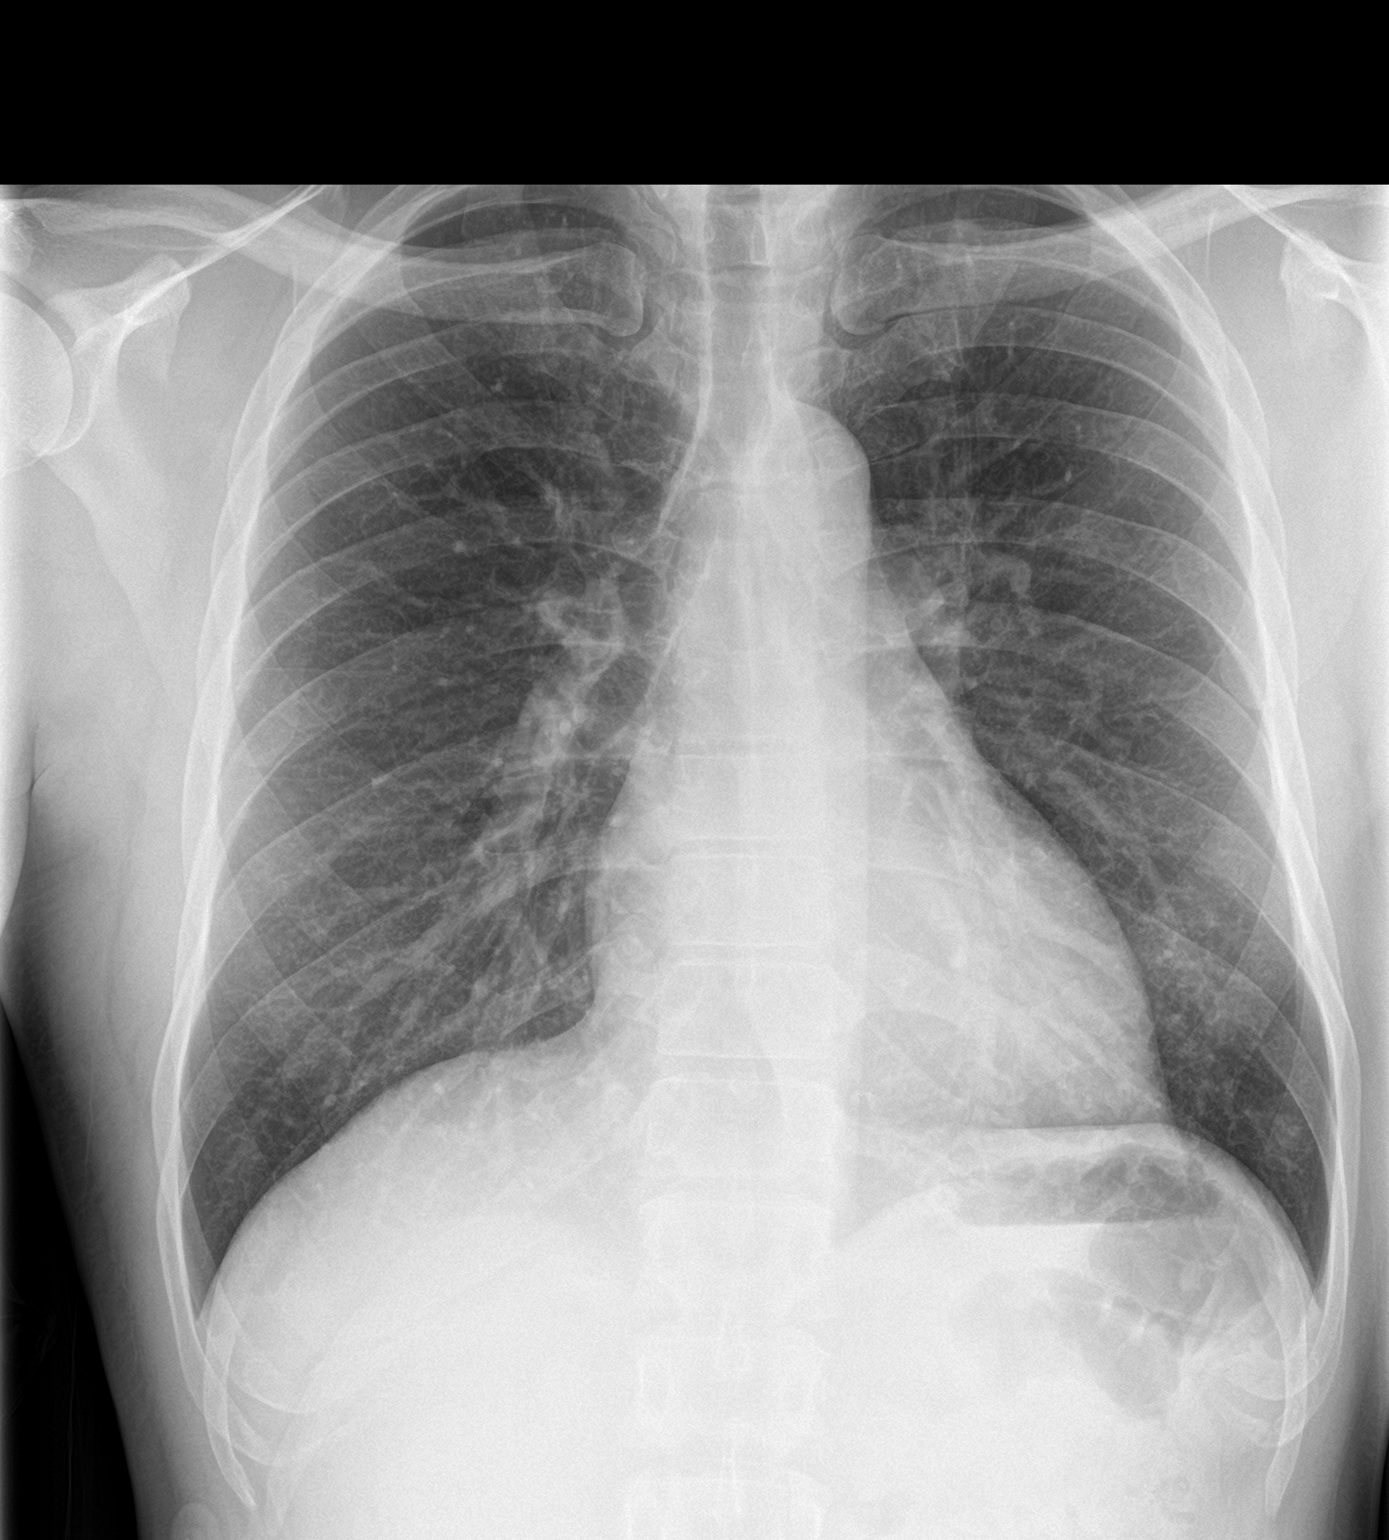

[chest lat]
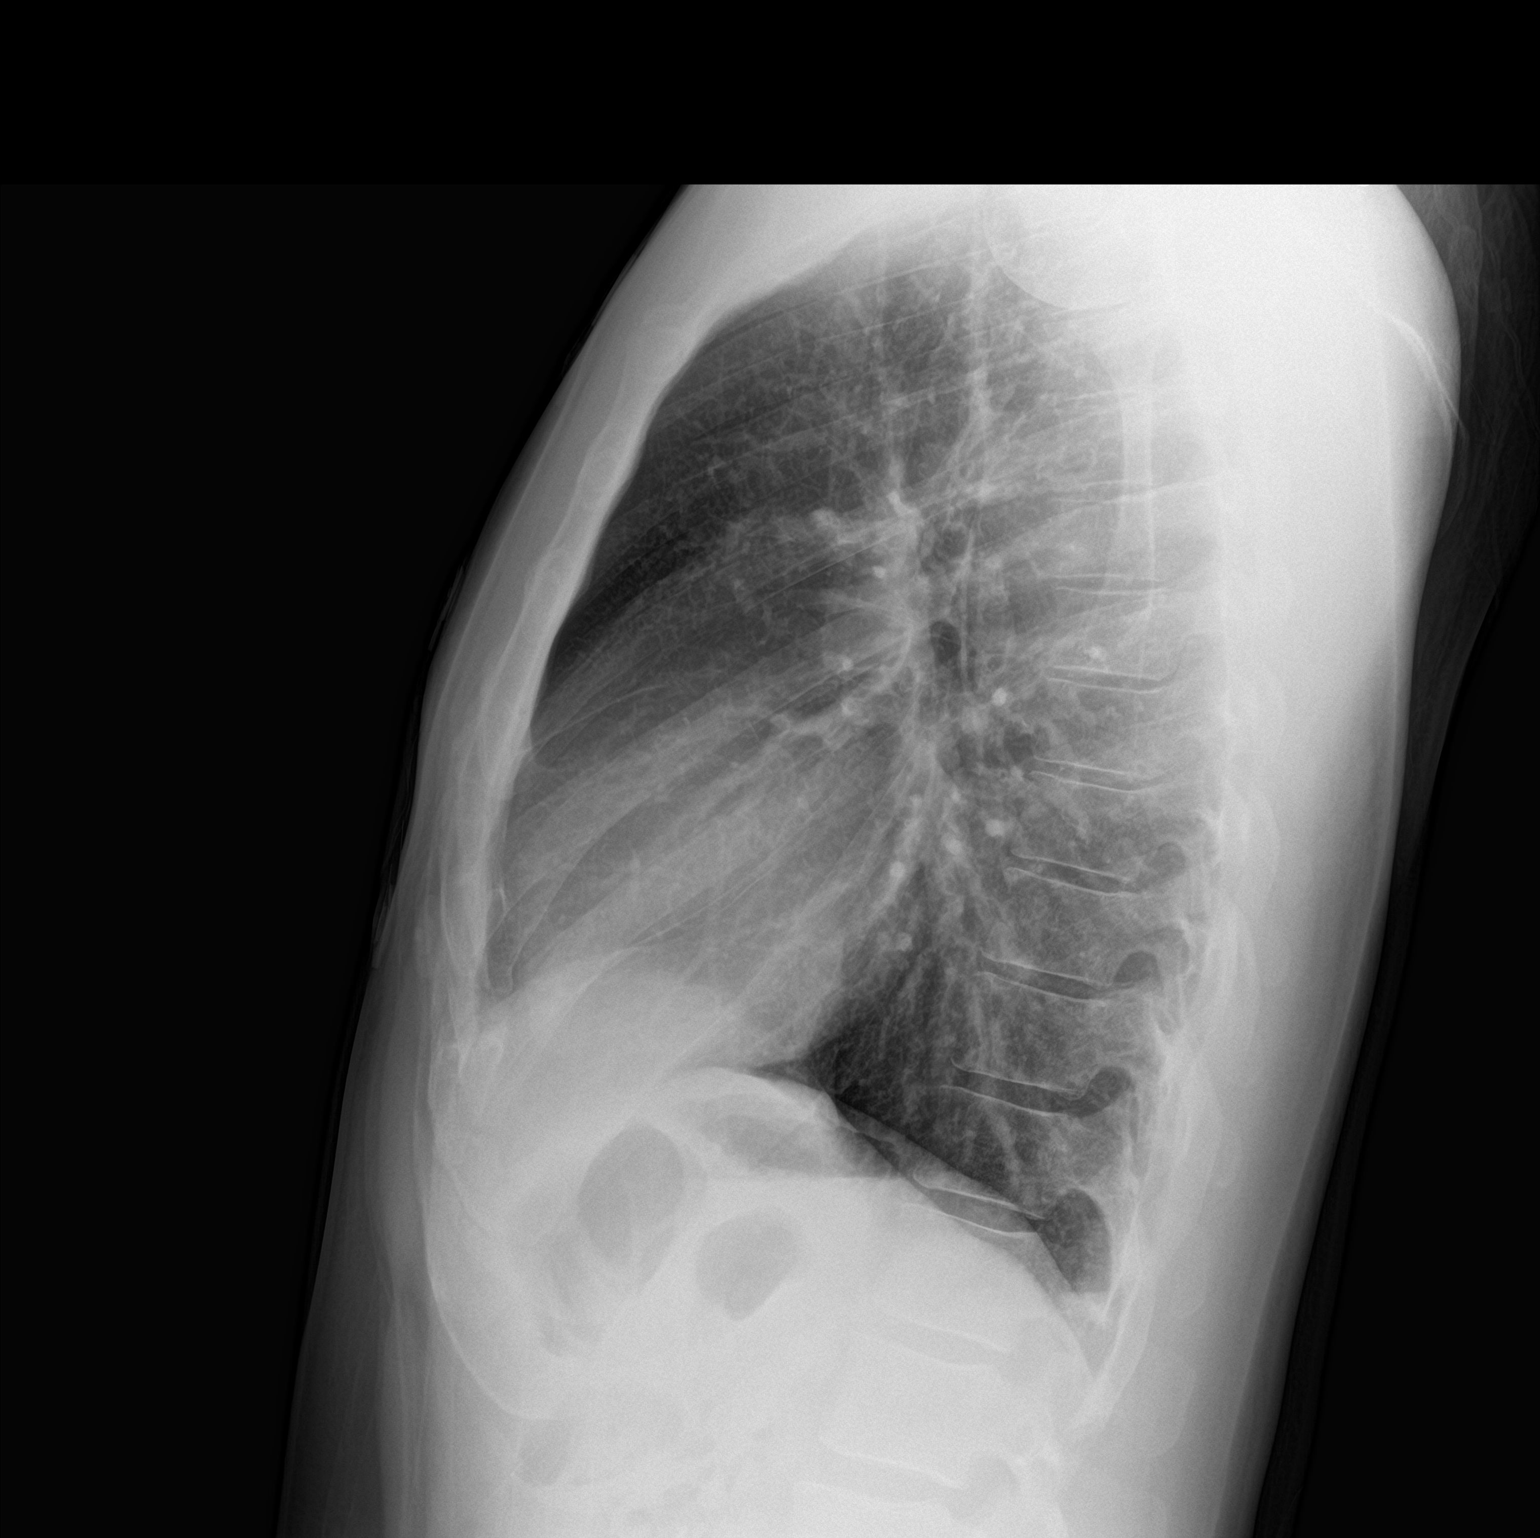

[2 of 2 positions shown; findings below may reference images not displayed]

FINDINGS: The heart size and mediastinal contours are within normal limits.
Both lungs are clear. The visualized skeletal structures are
unremarkable.
IMPRESSION: No active cardiopulmonary disease.
